# Patient Record
Sex: Female | Born: 1935 | Race: White | Hispanic: No | Smoking: Former smoker
Health system: Southern US, Community
[De-identification: ages and names within clinical notes are randomized; demographics above are authoritative.]

## PROBLEM LIST (undated history)

## (undated) DIAGNOSIS — E785 Hyperlipidemia, unspecified: Secondary | ICD-10-CM

## (undated) DIAGNOSIS — C801 Malignant (primary) neoplasm, unspecified: Secondary | ICD-10-CM

## (undated) DIAGNOSIS — E039 Hypothyroidism, unspecified: Secondary | ICD-10-CM

## (undated) DIAGNOSIS — M5481 Occipital neuralgia: Secondary | ICD-10-CM

## (undated) DIAGNOSIS — I10 Essential (primary) hypertension: Secondary | ICD-10-CM

## (undated) DIAGNOSIS — M1990 Unspecified osteoarthritis, unspecified site: Secondary | ICD-10-CM

## (undated) DIAGNOSIS — F0390 Unspecified dementia without behavioral disturbance: Secondary | ICD-10-CM

## (undated) DIAGNOSIS — M1711 Unilateral primary osteoarthritis, right knee: Secondary | ICD-10-CM

## (undated) DIAGNOSIS — M8580 Other specified disorders of bone density and structure, unspecified site: Secondary | ICD-10-CM

## (undated) HISTORY — PX: TUBAL LIGATION: SHX77

## (undated) HISTORY — PX: APPENDECTOMY: SHX54

## (undated) HISTORY — DX: Hyperlipidemia, unspecified: E78.5

## (undated) HISTORY — DX: Other specified disorders of bone density and structure, unspecified site: M85.80

## (undated) HISTORY — DX: Unilateral primary osteoarthritis, right knee: M17.11

## (undated) HISTORY — DX: Essential (primary) hypertension: I10

## (undated) HISTORY — DX: Malignant (primary) neoplasm, unspecified: C80.1

## (undated) HISTORY — DX: Hypothyroidism, unspecified: E03.9

## (undated) HISTORY — DX: Occipital neuralgia: M54.81

---

## 2009-05-30 ENCOUNTER — Ambulatory Visit: Payer: Self-pay | Admitting: Family Medicine

## 2009-05-30 DIAGNOSIS — R519 Headache, unspecified: Secondary | ICD-10-CM | POA: Insufficient documentation

## 2009-05-30 DIAGNOSIS — H60399 Other infective otitis externa, unspecified ear: Secondary | ICD-10-CM | POA: Insufficient documentation

## 2009-05-30 DIAGNOSIS — E785 Hyperlipidemia, unspecified: Secondary | ICD-10-CM

## 2009-05-30 DIAGNOSIS — R51 Headache: Secondary | ICD-10-CM

## 2009-05-30 DIAGNOSIS — I10 Essential (primary) hypertension: Secondary | ICD-10-CM | POA: Insufficient documentation

## 2009-05-30 DIAGNOSIS — E039 Hypothyroidism, unspecified: Secondary | ICD-10-CM | POA: Insufficient documentation

## 2009-07-13 ENCOUNTER — Ambulatory Visit: Payer: Self-pay | Admitting: Family Medicine

## 2009-07-13 DIAGNOSIS — M1710 Unilateral primary osteoarthritis, unspecified knee: Secondary | ICD-10-CM

## 2009-11-14 ENCOUNTER — Ambulatory Visit: Payer: Self-pay | Admitting: Family Medicine

## 2009-11-14 ENCOUNTER — Encounter: Admission: RE | Admit: 2009-11-14 | Discharge: 2009-11-14 | Payer: Self-pay | Admitting: Family Medicine

## 2009-11-15 ENCOUNTER — Encounter: Payer: Self-pay | Admitting: Family Medicine

## 2009-11-20 DIAGNOSIS — R928 Other abnormal and inconclusive findings on diagnostic imaging of breast: Secondary | ICD-10-CM | POA: Insufficient documentation

## 2009-12-04 ENCOUNTER — Ambulatory Visit: Payer: Self-pay | Admitting: Family Medicine

## 2009-12-20 ENCOUNTER — Telehealth: Payer: Self-pay | Admitting: Family Medicine

## 2010-01-02 DIAGNOSIS — C801 Malignant (primary) neoplasm, unspecified: Secondary | ICD-10-CM

## 2010-01-02 HISTORY — DX: Malignant (primary) neoplasm, unspecified: C80.1

## 2010-01-10 ENCOUNTER — Encounter: Admission: RE | Admit: 2010-01-10 | Discharge: 2010-01-10 | Payer: Self-pay | Admitting: Family Medicine

## 2010-01-10 ENCOUNTER — Encounter: Payer: Self-pay | Admitting: Family Medicine

## 2010-01-16 ENCOUNTER — Ambulatory Visit: Payer: Self-pay | Admitting: Family Medicine

## 2010-01-16 DIAGNOSIS — C50919 Malignant neoplasm of unspecified site of unspecified female breast: Secondary | ICD-10-CM | POA: Insufficient documentation

## 2010-01-19 ENCOUNTER — Encounter: Payer: Self-pay | Admitting: Family Medicine

## 2010-03-06 ENCOUNTER — Ambulatory Visit
Admission: RE | Admit: 2010-03-06 | Discharge: 2010-03-06 | Payer: Self-pay | Source: Home / Self Care | Attending: Family Medicine | Admitting: Family Medicine

## 2010-03-06 ENCOUNTER — Encounter: Payer: Self-pay | Admitting: Family Medicine

## 2010-03-06 DIAGNOSIS — Z780 Asymptomatic menopausal state: Secondary | ICD-10-CM | POA: Insufficient documentation

## 2010-03-07 ENCOUNTER — Encounter: Payer: Self-pay | Admitting: Family Medicine

## 2010-03-07 LAB — CONVERTED CEMR LAB
Alkaline Phosphatase: 79 units/L (ref 39–117)
BUN: 18 mg/dL (ref 6–23)
Creatinine, Ser: 1.16 mg/dL (ref 0.40–1.20)
Eosinophils Absolute: 0.1 10*3/uL (ref 0.0–0.7)
Eosinophils Relative: 1 % (ref 0–5)
Glucose, Bld: 111 mg/dL — ABNORMAL HIGH (ref 70–99)
HCT: 30.9 % — ABNORMAL LOW (ref 36.0–46.0)
HDL: 65 mg/dL (ref >39)
LDL Cholesterol: 81 mg/dL (ref 0–99)
Lymphs Abs: 2.5 10*3/uL (ref 0.7–4.0)
MCV: 94.5 fL (ref 78.0–100.0)
Monocytes Absolute: 0.9 10*3/uL (ref 0.1–1.0)
Monocytes Relative: 9 % (ref 3–12)
Platelets: 305 10*3/uL (ref 150–400)
Sodium: 135 meq/L (ref 135–145)
Total Bilirubin: 0.6 mg/dL (ref 0.3–1.2)
Triglycerides: 160 mg/dL — ABNORMAL HIGH (ref <150)
VLDL: 32 mg/dL (ref 0–40)
WBC: 10.3 10*3/uL (ref 4.0–10.5)

## 2010-03-14 ENCOUNTER — Encounter: Payer: Self-pay | Admitting: Family Medicine

## 2010-03-24 ENCOUNTER — Encounter: Payer: Self-pay | Admitting: Family Medicine

## 2010-03-25 ENCOUNTER — Encounter: Payer: Self-pay | Admitting: Family Medicine

## 2010-03-27 ENCOUNTER — Encounter: Payer: Self-pay | Admitting: Family Medicine

## 2010-04-03 NOTE — Consult Note (Signed)
Summary: [facility]   Imported By: Lanelle Bal 02/06/2010 13:43:34  _____________________________________________________________________  External Attachment:    Type:   Image     Comment:   External Document

## 2010-04-03 NOTE — Assessment & Plan Note (Signed)
Summary: NOV knee DJD/ med RFs   Vital Signs:  Patient profile:   77 year old female Height:      64 inches Weight:      137 pounds BMI:     23.60 O2 Sat:      98 % on Room air Pulse rate:   72 / minute BP sitting:   135 / 63  (left arm) Cuff size:   regular  Vitals Entered By: Payton Spark CMA (Jul 13, 2009 10:26 AM)  O2 Flow:  Room air CC: New to est.    Primary Care Provider:  Seymour Bars DO  CC:  New to est. .  History of Present Illness: 77 yo WF presents for NOV.  She has a hx of HTN, hypothyroidism, high cholesterol and osteopenia.  She is doing well on her meds and is due for RFs.  She recently had labs drawn in [place].  Her mammogram is due.  Last pap in 09 all normal and she is widowed w/o vaginal bleeding.  Was never on HRT.  Fells well other than DJD in her R hip s/p ORIF in 04 and severe knee DJD bilat.  She has seen Dr supple but refuses to do surgery.  she takes tylenol as needed pain and uses a cane when needed.  She lives alone and stays active around the house.    Current Medications (verified): 1)  Metoprolol Tartrate 50 Mg Tabs (Metoprolol Tartrate) .... Take 1 Tab By Mouth Once Daily 2)  Lisinopril-Hydrochlorothiazide 20-12.5 Mg Tabs (Lisinopril-Hydrochlorothiazide) .... Take 1 Tab By Mouth Once Daily 3)  Simvastatin 80 Mg Tabs (Simvastatin) .... Take 1 Tab By Mouth At Bedtime 4)  Levothroid 88 Mcg Tabs (Levothyroxine Sodium) .... Take 1 Tab By Mouth Once Daily 5)  Aspirin 81 Mg Tbec (Aspirin) .... Once Daily 6)  Vitamin D 2000 Unit Tabs (Cholecalciferol) .... Take 1 Tab By Mouth Once Daily  Allergies (verified): 1)  ! Codeine Sulfate (Codeine Sulfate) 2)  ! Penicillin G Potassium (Penicillin G Potassium)  Past History:  Past Medical History: Hyperlipidemia Hypertension Hypothyroidism Occipital neuralgia 30 years ago osteopenia  ortho Dr Rennis Chris  Past Surgical History: tubal ligation Appendectomy Hip surgery- R, ORIF in 04.  Social  History: quit smoking 12 years ago after smoking for several years Alcohol use-yes- glass of win with dinner Drug use-no Regular exercise-no Lives alone. Widowed since 2003.  2 sons - in [place] and [place].  Review of Systems       no fevers/sweats/weakness, unexplained wt loss/gain, no change in vision, no difficulty hearing, ringing in ears, no hay fever/allergies, no CP/discomfort, no palpitations, no breast lump/nipple discharge, no cough/wheeze, no blood in stool,no  N/V/D, no nocturia, + leaking urine, no unusual vag bleeding, no vaginal/penile discharge, no muscle/+joint pain, no rash, no new/changing mole, no HA, no memory loss, no anxiety, no sleep problem, no depression, no unexplained lumps, no easy bruising/bleeding, no concern with sexual function   Physical Exam  General:  alert, well-developed, well-nourished, and well-hydrated.   Head:  normocephalic and atraumatic.   Eyes:  clouding of the anterior chambers bilat Ears:  scab, healing over the L pinna Nose:  no nasal discharge.   Mouth:  pharynx pink and moist.   Neck:  no masses.  no audible carotid bruits Lungs:  normal respiratory effort, no intercostal retractions, no accessory muscle use, normal breath sounds, no crackles, and no wheezes.   Heart:  normal rate, regular rhythm, and no murmur.  Abdomen:  soft, non-tender, normal bowel sounds, no distention, and no guarding.   Msk:  no joint swelling, no joint warmth, and no redness over joints.   Pulses:  2+ radial pulses Extremities:  no LE edema Neurologic:  antalgic gait Skin:  color normal.   Psych:  memory intact for recent and remote, good eye contact, not anxious appearing, and not depressed appearing.     Impression & Recommendations:  Problem # 1:  GENERALIZED OSTEOARTHROSIS UNSPECIFIED SITE (ICD-715.00) DJD in the knees and R hip.  We discussed options like Celebrex, synvisc for her knees.  She does not want joint replacement.  For now, she agrees to use  Tylenol arthritis as needed pain.  Will consider NSAIDs after I get her BUN/Cr from [place]. Her updated medication list for this problem includes:    Aspirin 81 Mg Tbec (Aspirin) ..... Once daily  Problem # 2:  HYPERTENSION (ICD-401.9) At goal on current meds.  RFd.  Get old records. Her updated medication list for this problem includes:    Metoprolol Succinate 50 Mg Xr24h-tab (Metoprolol succinate) .Marland Kitchen... 1 tab by mouth once daily    Lisinopril-hydrochlorothiazide 20-12.5 Mg Tabs (Lisinopril-hydrochlorothiazide) .Marland Kitchen... Take 1 tab by mouth once daily  BP today: 135/63 Prior BP: 167/81 (05/30/2009)  Problem # 3:  HYPOTHYROIDISM (ICD-244.9)  Her updated medication list for this problem includes:    Levothroid 88 Mcg Tabs (Levothyroxine sodium) .Marland Kitchen... Take 1 tab by mouth once daily  Problem # 4:  OTHER SCREENING MAMMOGRAM (ICD-V76.12)  Orders: T-Mammography Bilateral Screening (09811)  Complete Medication List: 1)  Metoprolol Succinate 50 Mg Xr24h-tab (Metoprolol succinate) .Marland Kitchen.. 1 tab by mouth once daily 2)  Lisinopril-hydrochlorothiazide 20-12.5 Mg Tabs (Lisinopril-hydrochlorothiazide) .... Take 1 tab by mouth once daily 3)  Simvastatin 80 Mg Tabs (Simvastatin) .... Take 1 tab by mouth at bedtime 4)  Levothroid 88 Mcg Tabs (Levothyroxine sodium) .... Take 1 tab by mouth once daily 5)  Aspirin 81 Mg Tbec (Aspirin) .... Once daily 6)  Vitamin D 2000 Unit Tabs (Cholecalciferol) .... Take 1 tab by mouth once daily  Patient Instructions: 1)  Meds RFd -- to [employer] [facility]. 2)  Update mammogram downstairs. 3)  Return for follow up arthritis/ BP in 4 mos. 4)  Try Tyelnol arthritis as needed for pain. 5)  Call if you want referral to ortho downstairs  re: synvisc injections for severe knee arthritis Prescriptions: LEVOTHROID 88 MCG TABS (LEVOTHYROXINE SODIUM) Take 1 tab by mouth once daily  #90 x 1   Entered and Authorized by:   Seymour Bars DO   Signed by:   Seymour Bars DO on  07/13/2009   Method used:   Print then Give to Patient   RxID:   [id number] SIMVASTATIN 80 MG TABS (SIMVASTATIN) Take 1 tab by mouth at bedtime  #90 x 1   Entered and Authorized by:   Seymour Bars DO   Signed by:   Seymour Bars DO on 07/13/2009   Method used:   Print then Give to Patient   RxID:   [id number] LISINOPRIL-HYDROCHLOROTHIAZIDE 20-12.5 MG TABS (LISINOPRIL-HYDROCHLOROTHIAZIDE) Take 1 tab by mouth once daily  #90 x 1   Entered and Authorized by:   Seymour Bars DO   Signed by:   Seymour Bars DO on 07/13/2009   Method used:   Print then Give to Patient   RxID:   [id number] METOPROLOL SUCCINATE 50 MG XR24H-TAB (METOPROLOL SUCCINATE) 1 tab by mouth once daily  #90 x 1  Entered and Authorized by:   Seymour Bars DO   Signed by:   Seymour Bars DO on 07/13/2009   Method used:   Print then Give to Patient   RxID:   [id number]
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
## 2010-04-03 NOTE — Assessment & Plan Note (Signed)
Summary: DJD knee   Vital Signs:  Patient profile:   77 year old female Height:      64 inches Weight:      136 pounds BMI:     23.43 O2 Sat:      99 % on Room air Pulse rate:   80 / minute BP sitting:   140 / 82  (left arm) Cuff size:   regular  Vitals Entered By: Payton Spark CMA (November 14, 2009 1:36 PM)  O2 Flow:  Room air CC: F/U HTN and arthritis   Primary Care Safira Proffit:  Seymour Bars DO  CC:  F/U HTN and arthritis.  History of Present Illness: 77 yo WF presents for f/u R DJD that began about 2 yrs ago.  She had an XRAY done out of town originally.  She wanted to hold off on joint replacment.  She is not taking anything OTC.  She is interested in Synvisc injections.  She feels like her knee pain limits her functional ability.  She has never had a steroid injection in her knee.  She has never had any swelling.  Denies any problems with the L knee or hx of injuries to the R knee or previous surgeries.    She just had her mammogram.  Reports no problems on her BP meds.  She had labs checked in Jan 2011.   Current Medications (verified): 1)  Metoprolol Succinate 50 Mg Xr24h-Tab (Metoprolol Succinate) .Marland Kitchen.. 1 Tab By Mouth Once Daily 2)  Lisinopril-Hydrochlorothiazide 20-12.5 Mg Tabs (Lisinopril-Hydrochlorothiazide) .... Take 1 Tab By Mouth Once Daily 3)  Simvastatin 80 Mg Tabs (Simvastatin) .... Take 1 Tab By Mouth At Bedtime 4)  Levothroid 88 Mcg Tabs (Levothyroxine Sodium) .... Take 1 Tab By Mouth Once Daily 5)  Aspirin 81 Mg Tbec (Aspirin) .... Once Daily 6)  Vitamin D 2000 Unit Tabs (Cholecalciferol) .... Take 1 Tab By Mouth Once Daily  Allergies (verified): 1)  ! Codeine Sulfate (Codeine Sulfate) 2)  ! Penicillin G Potassium (Penicillin G Potassium)  Past History:  Past Medical History: Hyperlipidemia Hypertension Hypothyroidism Occipital neuralgia 30 years ago osteopenia R knee DJD  Past Surgical History: Reviewed history from 07/13/2009 and no changes  required. tubal ligation Appendectomy Hip surgery- R, ORIF in 04.  Social History: Reviewed history from 07/13/2009 and no changes required. quit smoking 12 years ago after smoking for several years Alcohol use-yes- glass of win with dinner Drug use-no Regular exercise-no Lives alone. Widowed since 2003.  2 sons - in [place] and [place].  Review of Systems      See HPI  Physical Exam  General:  alert, well-developed, well-nourished, and well-hydrated.   Head:  normocephalic and atraumatic.   Mouth:  pharynx pink and moist.   Neck:  no masses.   Lungs:  normal respiratory effort, no intercostal retractions, no accessory muscle use, normal breath sounds, no crackles, and no wheezes.   Heart:  normal rate, regular rhythm, and no murmur.   Msk:  no joint effusions. antalgic gait toward the L.  ambulating w/o assistance or device tender over later joint line with a neg patellar apprehension test and a neg mcmurray test Extremities:  no LE edema Skin:  color normal.   Psych:  good eye contact, not anxious appearing, and not depressed appearing.     Impression & Recommendations:  Problem # 1:  OSTEOARTHRITIS, KNEE, RIGHT (ICD-715.96) R knee DJD with xray  ~2 yrs ago (not here).  She c/o pain and some limitations  in function but so far, has not taken anything or tried any kind of intervention other than sporadic use of a cane. Will start her on celebrex once daily + glucosamine daily. REcheck in 6 wks.  Call if any adverse SEs occur.  If not improving, will XRAY her knee and get her in with ortho.  May benefit from steroid injection in the knee or Synvisc series. Her updated medication list for this problem includes:    Aspirin 81 Mg Tbec (Aspirin) ..... Once daily    Celebrex 200 Mg Caps (Celecoxib) .Marland Kitchen... 1 capsule by mouth daily with food  Problem # 2:  HYPERTENSION (ICD-401.9) BP at goal.  Continue both meds.  REcheck labs in Jan. Her updated medication list for this problem  includes:    Metoprolol Succinate 50 Mg Xr24h-tab (Metoprolol succinate) .Marland Kitchen... 1 tab by mouth once daily    Lisinopril-hydrochlorothiazide 20-12.5 Mg Tabs (Lisinopril-hydrochlorothiazide) .Marland Kitchen... Take 1 tab by mouth once daily  BP today: 140/82 Prior BP: 135/63 (07/13/2009)  Problem # 3:  HYPOTHYROIDISM (ICD-244.9) Recheck TSH at 6 wk f/u visit. Her updated medication list for this problem includes:    Levothroid 88 Mcg Tabs (Levothyroxine sodium) .Marland Kitchen... Take 1 tab by mouth once daily  Complete Medication List: 1)  Metoprolol Succinate 50 Mg Xr24h-tab (Metoprolol succinate) .Marland Kitchen.. 1 tab by mouth once daily 2)  Lisinopril-hydrochlorothiazide 20-12.5 Mg Tabs (Lisinopril-hydrochlorothiazide) .... Take 1 tab by mouth once daily 3)  Simvastatin 80 Mg Tabs (Simvastatin) .... 1/2 tab by mouth at bedtime 4)  Levothroid 88 Mcg Tabs (Levothyroxine sodium) .... Take 1 tab by mouth once daily 5)  Aspirin 81 Mg Tbec (Aspirin) .... Once daily 6)  Vitamin D 2000 Unit Tabs (Cholecalciferol) .... Take 1 tab by mouth once daily 7)  Celebrex 200 Mg Caps (Celecoxib) .Marland Kitchen.. 1 capsule by mouth daily with food  Patient Instructions: 1)  Start Celebrex,  1 capsule with breakfast daily. 2)  Add Osteo- Biflex daily (glucosamine) for arthritis. 3)  Return in 6 wks.  4)  If DJD pain has not improved, will XRAY your knee and get you in with ortho. 5)  Call if any problems. Prescriptions: CELEBREX 200 MG CAPS (CELECOXIB) 1 capsule by mouth daily with food  #30 x 1   Entered and Authorized by:   Seymour Bars DO   Signed by:   Seymour Bars DO on 11/14/2009   Method used:   Printed then faxed to ...       [facility]  [address]  [id number] (retail)       [address]       [place], [place]  [address]       Ph: [phone] or [phone]       Fax: [phone]   RxID:   [id number]

## 2010-04-03 NOTE — Medication Information (Signed)
Summary: Approval for Celebrex/[employer]  Approval for Celebrex/[employer]   Imported By: Lanelle Bal 12/01/2009 11:27:25  _____________________________________________________________________  External Attachment:    Type:   Image     Comment:   External Document

## 2010-04-03 NOTE — Assessment & Plan Note (Signed)
Summary: HEAD PAIN   Vital Signs:  Patient Profile:   77 Years Old Female CC:      Occipital head pain intermittent- dull X 4 days Height:     64 inches Weight:      137 pounds O2 Sat:      100 % O2 treatment:    Room Air Temp:     97.0 degrees F oral Pulse rate:   67 / minute Pulse rhythm:   regular Resp:     14 per minute BP sitting:   167 / 81  (left arm) Cuff size:   regular  Pt. in pain?   no    Location:   occipital  Vitals Entered By: Lajean Saver RN (May 30, 2009 2:37 PM)                   Updated Prior Medication List: LOPRESSOR 50 MG TABS (METOPROLOL TARTRATE) once daily LISINOPRIL 20 MG TABS (LISINOPRIL) once daily SIMVASTATIN 80 MG TABS (SIMVASTATIN) qhs LEVOTHROID 88 MCG TABS (LEVOTHYROXINE SODIUM) once daily ASPIRIN 81 MG TBEC (ASPIRIN) once daily  Current Allergies: ! CODEINE SULFATE (CODEINE SULFATE) ! PENICILLIN G POTASSIUM (PENICILLIN G POTASSIUM)History of Present Illness Chief Complaint: Occipital head pain intermittent- dull X 4 days History of Present Illness: Subjective:  Patient complains of onset of occipital headache 3 days ago.  The headache consists of intermittent brief flashes of dull ache, which does not awaken her.  At the same time she developed a sensitivity of her left ear.  She denies rash.  No neuro symptoms.  No fevers, chills, and sweats.  She feels well.  No recent injury or change in physical activities.  She states that about 35 years ago she had occipital neuralgia that was effectively treated with Corgard.  She states that she had a Zostavax injection one year ago.  REVIEW OF SYSTEMS Constitutional Symptoms      Denies fever, chills, night sweats, weight loss, weight gain, and fatigue.  Eyes       Denies change in vision, eye pain, eye discharge, glasses, contact lenses, and eye surgery. Ear/Nose/Throat/Mouth       Denies hearing loss/aids, change in hearing, ear pain, ear discharge, dizziness, frequent runny nose,  frequent nose bleeds, sinus problems, sore throat, hoarseness, and tooth pain or bleeding.  Respiratory       Denies dry cough, productive cough, wheezing, shortness of breath, asthma, bronchitis, and emphysema/COPD.  Cardiovascular       Denies murmurs, chest pain, and tires easily with exhertion.    Gastrointestinal       Denies stomach pain, nausea/vomiting, diarrhea, constipation, blood in bowel movements, and indigestion. Genitourniary       Denies painful urination, kidney stones, and loss of urinary control. Neurological       Complains of headaches.      Denies paralysis, seizures, and fainting/blackouts. Musculoskeletal       Denies muscle pain, joint pain, joint stiffness, decreased range of motion, redness, swelling, muscle weakness, and gout.  Skin       Denies bruising, unusual mles/lumps or sores, and hair/skin or nail changes.  Psych       Denies mood changes, temper/anger issues, anxiety/stress, speech problems, depression, and sleep problems. Other Comments: Patient was diagnosed with ocipital neuralgia 30 years ago, HAs resumed 4 days ago in her occipital region, the pain is described as an ache and intermittent. Denies nausea or vison changes. She does not currently c/o HA  Past History:  Past Medical History: Hyperlipidemia Hypertension Hypothyroidism Occipital neuralgia 30 years ago  Past Surgical History: tubal ligation Appendectomy  Family History: unsure  Social History: quit smoking 12 years ago after smoking for several years Alcohol use-yes- glass of win with dinner Drug use-no Regular exercise-no Smoking Status:  current Drug Use:  no Does Patient Exercise:  no   Objective:  Appearance:  Patient appears healthy, stated age, and in no acute distress  Head:  no tenderness to palpation occipital area.  No rash.  No swelling Eyes:  Pupils are equal, round, and reactive to light and accomdation.  Extraocular movement is intact.  Conjunctivae  are not inflamed.  Fundi benign Ears:  Canals normal.  Tympanic membranes normal.  Left external ear has 2cm area of erythema and tenderness over the anterior portion (crus of helix) Nose:  No congestion.  No sinus tenderness Pharynx:  Normal  Neck:  Supple.  No adenopathy is present; full range of motion  Lungs:  Clear to auscultation.  Breath sounds are equal.  Heart:  Regular rate and rhythm without murmurs, rubs, or gallops.  Abdomen:  Nontender without masses or hepatosplenomegaly.  Bowel sounds are present.  No CVA or flank tenderness.  Neurologic:  Cranial nerves 2 through 12 are normal.  Patellar, achilles, and elbow reflexes are normal.  Cerebellar function is intact.  Gait and station are normal.  Grip strength symmetric bilaterally.   Assessment New Problems: UNSPECIFIED INFECTIVE OTITIS EXTERNA (ICD-380.10) HEADACHE (ICD-784.0) HYPOTHYROIDISM (ICD-244.9) HYPERTENSION (ICD-401.9) HYPERLIPIDEMIA (ICD-272.4)  SYMPTOMS ARE SUGGESTIVE OF HERPES ZOSTER BUT SHE HAS BEEN IMMUNIZED.  WILL TREAT FOR CELLULITIS OF EXTERNAL EAR. HAS NORMAL NEUROLOGIC EXAM  Plan New Medications/Changes: MINOCYCLINE HCL 100 MG CAPS (MINOCYCLINE HCL) 1 by mouth two times a day  #14 x 0, 05/30/2009, Donna Christen MD  New Orders: New Patient Level III [phone] Planning Comments:   Begin minocycline.  Apply warm compresses.  May take Tylenol If headache worsens will need MRI (Follow-up with PCP if not improving or develops worsening symptoms)   The patient and/or caregiver has been counseled thoroughly with regard to medications prescribed including dosage, schedule, interactions, rationale for use, and possible side effects and they verbalize understanding.  Diagnoses and expected course of recovery discussed and will return if not improved as expected or if the condition worsens. Patient and/or caregiver verbalized understanding.  Prescriptions: MINOCYCLINE HCL 100 MG CAPS (MINOCYCLINE HCL) 1 by mouth two  times a day  #14 x 0   Entered and Authorized by:   Donna Christen MD   Signed by:   Donna Christen MD on 05/30/2009   Method used:   Print then Give to Patient   RxID:   [id number]

## 2010-04-03 NOTE — Progress Notes (Signed)
Summary: Pt question about 01/02/10 appt  Phone Note Call from Patient Call back at Home Phone [phone]   Caller: Patient Reason for Call: Talk to Nurse Summary of Call: Pt states celebrex is not helping knee pain, she is not sure if she needs to keep 01/02/10 appt since she doesn't feel like there is anything that can be done for her arthritis in her knee, pls call pt back to advise Initial call taken by: Lannette Donath,  December 20, 2009 11:08 AM  Follow-up for Phone Call        I reviewed her notes.  She does not need to keep this appt with me but I do want her to see ortho for her knee pain.  It looks like we discussed ortho doing synvisc shots as the next step.  Let me know if she needs [place] to set this up for her. Follow-up by: Seymour Bars DO,  December 20, 2009 12:04 PM     Appended Document: Pt question about 01/02/10 appt Pt states she will wait to see how she feels and call us

## 2010-04-03 NOTE — Assessment & Plan Note (Signed)
Summary: newly diagnosed breast cancer   Vital Signs:  Patient profile:   77 year old female Height:      64 inches Weight:      135 pounds BMI:     23.26 O2 Sat:      99 % on Room air Pulse rate:   79 / minute BP sitting:   128 / 72  (left arm) Cuff size:   regular  Vitals Entered By: Payton Spark CMA (January 16, 2010 12:49 PM)  O2 Flow:  Room air CC: Discuss biopsy report   Primary Care Provider:  Seymour Bars DO  CC:  Discuss biopsy report.  History of Present Illness: 77 yo WF presents for f/u after having a breast biopsy done on 11-9/11 after having an abnormal mammogram done.  Her path came back + for invasive ductal carcinoma that appears to be low grade with no evidence of angiolymphatic invasion.  She was set up to see a surgeone but they were not covered under her insurance.  She would like to proceed with seeing a gen surgeron at [facility].    She has not yet told her family.  She is tearful.  She is bruised from the biopsy but she is not having much pain.    Allergies: 1)  ! Codeine Sulfate (Codeine Sulfate) 2)  ! Penicillin G Potassium (Penicillin G Potassium)  Past History:  Past Medical History: Reviewed history from 11/14/2009 and no changes required. Hyperlipidemia Hypertension Hypothyroidism Occipital neuralgia 30 years ago osteopenia R knee DJD  Past Surgical History: Reviewed history from 07/13/2009 and no changes required. tubal ligation Appendectomy Hip surgery- R, ORIF in 04.  Family History: Reviewed history from 05/30/2009 and no changes required. unsure  Social History: Reviewed history from 07/13/2009 and no changes required. quit smoking 12 years ago after smoking for several years Alcohol use-yes- glass of win 5with dinner Drug use-no Regular exercise-no Lives alone. Widowed since 2003.  2 sons - in [place] and [place].  Review of Systems      See HPI  Physical Exam  General:  alert, well-developed, well-nourished, and  well-hydrated.   Breasts:  brusing over the entire R breast.  steri - strips in place.  biopsy site appears clean. Psych:  good eye contact and tearful.     Impression & Recommendations:  Problem # 1:  NEOPLASM, MALIGNANT, RIGHT BREAST (ICD-174.9)  Reviewed R breast biopsy path results + for intraductal carcinoma.  She is handling the news fairly well but we need to find a general surgeon that she can see with her insurance.  Biopsy site bruised but is healing well with minimal pain.    Orders: Surgical Referral (Surgery)  Complete Medication List: 1)  Metoprolol Succinate 50 Mg Xr24h-tab (Metoprolol succinate) .Marland Kitchen.. 1 tab by mouth once daily 2)  Lisinopril-hydrochlorothiazide 20-12.5 Mg Tabs (Lisinopril-hydrochlorothiazide) .... Take 1 tab by mouth once daily 3)  Simvastatin 80 Mg Tabs (Simvastatin) .... 1/2 tab by mouth at bedtime 4)  Levothroid 88 Mcg Tabs (Levothyroxine sodium) .... Take 1 tab by mouth once daily 5)  Aspirin 81 Mg Tbec (Aspirin) .... Once daily 6)  Vitamin D 2000 Unit Tabs (Cholecalciferol) .... Take 1 tab by mouth once daily 7)  Celebrex 200 Mg Caps (Celecoxib) .Marland Kitchen.. 1 capsule by mouth daily with food  Patient Instructions: 1)  Will work on getting you in with a general surgeon in [place]. 2)  Selena Batten will call you w/ the information. 3)  Call me if you need  anything!!!   Orders Added: 1)  Est. Patient Level III [45409] 2)  Surgical Referral [Surgery]

## 2010-04-04 HISTORY — PX: BREAST SURGERY: SHX581

## 2010-04-05 NOTE — Assessment & Plan Note (Signed)
Summary: CPE   Vital Signs:  Patient profile:   77 year old female Height:      64 inches Weight:      134 pounds BMI:     23.08 O2 Sat:      99 % on Room air Pulse rate:   73 / minute BP sitting:   157 / 77  (left arm) Cuff size:   regular  Vitals Entered By: Payton Spark CMA (March 06, 2010 10:12 AM)  O2 Flow:  Room air CC: CPE. Fasting labs.    Primary Care Provider:  Seymour Bars DO  CC:  CPE. Fasting labs. .  History of Present Illness: 77 yo WF presents for CPE today.  She was recently diagnosed with R breast cancer but has put off her lumpectomy and radiation treatment after seeing Dr Rayburn Ma for issues with transportatin, etc.  She plans to proceed with seeing Dr Tennis Ship in [facility] and doing her surgery with Dr Lafayette Dragon.  She would be OK with doing radiation.  She is due for an EKG, fasting labs, DEXA and TD vaccine.  Had her PNX at age 88.  Denies CP or DOE and has no cardiac hx.  She sees optho annually.  Does not exercise due to knee DJD.    Historically, she had a 'normal' colonoscopy 3 yrs ago and a 'normal' pap smear 2 yrs ago.  She is widowed with no previous cervical dysplasia.  She is not sexually active and denies any vag bleeding or discharge.     Current Medications (verified): 1)  Metoprolol Succinate 50 Mg Xr24h-Tab (Metoprolol Succinate) .Marland Kitchen.. 1 Tab By Mouth Once Daily 2)  Lisinopril-Hydrochlorothiazide 20-12.5 Mg Tabs (Lisinopril-Hydrochlorothiazide) .... Take 1 Tab By Mouth Once Daily 3)  Simvastatin 80 Mg Tabs (Simvastatin) .... 1/2 Tab By Mouth At Bedtime 4)  Levothroid 88 Mcg Tabs (Levothyroxine Sodium) .... Take 1 Tab By Mouth Once Daily 5)  Aspirin 81 Mg Tbec (Aspirin) .... Once Daily 6)  Vitamin D 2000 Unit Tabs (Cholecalciferol) .... Take 1 Tab By Mouth Once Daily 7)  Celebrex 200 Mg Caps (Celecoxib) .Marland Kitchen.. 1 Capsule By Mouth Daily With Food  Allergies (verified): 1)  ! Codeine Sulfate (Codeine Sulfate) 2)  ! Penicillin G Potassium (Penicillin  G Potassium)  Past History:  Past Medical History: Hyperlipidemia Hypertension Hypothyroidism Occipital neuralgia 30 years ago osteopenia R knee DJD R breast Cancer 01-2010 (Dr Kathaleen Grinder)  Past Surgical History: Reviewed history from 07/13/2009 and no changes required. tubal ligation Appendectomy Hip surgery- R, ORIF in 04.  Social History: Reviewed history from 01/16/2010 and no changes required. quit smoking 12 years ago after smoking for several years Alcohol use-yes- glass of win 5with dinner Drug use-no Regular exercise-no Lives alone. Widowed since 2003.  2 sons - in [place] and [place].  Review of Systems  The patient denies anorexia, fever, weight loss, weight gain, vision loss, decreased hearing, hoarseness, chest pain, syncope, dyspnea on exertion, peripheral edema, prolonged cough, headaches, hemoptysis, abdominal pain, melena, hematochezia, severe indigestion/heartburn, hematuria, incontinence, genital sores, muscle weakness, suspicious skin lesions, transient blindness, difficulty walking, depression, unusual weight change, abnormal bleeding, enlarged lymph nodes, angioedema, breast masses, and testicular masses.    Physical Exam  General:  alert, well-developed, well-nourished, and well-hydrated.   Head:  normocephalic and atraumatic.   Eyes:  pupils equal, pupils round, and pupils reactive to light.   Ears:  EACs patent; TMs translucent and gray with good cone of light and bony landmarks.  Nose:  no nasal discharge.   Mouth:  pharynx pink and moist and fair dentition.   Neck:  supple and no masses.  no audible carotid bruits Breasts:  (seeing gen surgery and oncology for new dx of R breast cancer) Lungs:  Normal respiratory effort, chest expands symmetrically. Lungs are clear to auscultation, no crackles or wheezes. Heart:  Normal rate and regular rhythm. S1 and S2 normal without gallop, murmur, click, rub or other extra sounds. Abdomen:  soft, non-tender,  normal bowel sounds, no distention, no masses, no guarding, no rigidity, no hepatomegaly, and no splenomegaly.  no AA bruits Msk:  no joint tenderness, no joint swelling, and no joint warmth.    heberdens/ bouchards nodes Pulses:  2+ radial and pedal pulses Extremities:  no UE or LE edema Skin:  color normal.  fair skinned. Cervical Nodes:  No lymphadenopathy noted Psych:  good eye contact, not anxious appearing, and not depressed appearing.     Impression & Recommendations:  Problem # 1:  HEALTH MAINTENANCE EXAM (ICD-V70.0) Keeping healthy checklist for women reviewed. BP high today.  Did not change her meds. Seeing Dr Tennis Ship Dr Lafayette Dragon for R breast cancer -- NEEDS TO SCHEDULE HER LUMPECTOMY AND RADIATION. Update DEXA. Update TD vaccine. Colonoscopy due in 2 yrs. Fasting labs today. EKG today.  NSR at 60 bpm with right atrial enlargement, QTC of 436 ms, o/w no ischemia or arrhythmia. MVI/ Calcium with D daily, healthy diet and regular exercise encouraged. RTC 4 mos for BP f/u.  Complete Medication List: 1)  Metoprolol Succinate 50 Mg Xr24h-tab (Metoprolol succinate) .Marland Kitchen.. 1 tab by mouth once daily 2)  Lisinopril-hydrochlorothiazide 20-12.5 Mg Tabs (Lisinopril-hydrochlorothiazide) .... Take 1 tab by mouth once daily 3)  Simvastatin 80 Mg Tabs (Simvastatin) .... 1/2 tab by mouth at bedtime 4)  Levothroid 88 Mcg Tabs (Levothyroxine sodium) .... Take 1 tab by mouth once daily 5)  Aspirin 81 Mg Tbec (Aspirin) .... Once daily 6)  Vitamin D 2000 Unit Tabs (Cholecalciferol) .... Take 1 tab by mouth once daily 7)  Celebrex 200 Mg Caps (Celecoxib) .Marland Kitchen.. 1 capsule by mouth daily with food  Other Orders: EKG w/ Interpretation (93000) T-CBC w/Diff ([id number]) T-Comprehensive Metabolic Panel [phone]) T-Lipid Profile [phone]) T-TSH [phone]) T-Dual DXA Bone Density/ Axial (38756) T-DXA Bone Density/ Appendicular (43329) TD Toxoids IM 7 YR + (51884) Admin 1st Vaccine  (16606)  Patient Instructions: 1)  EKG normal. 2)  Labs today. 3)  Will call you w/ results tomorrow. 4)  Will make sure you have everything you need for f/u with Dr Sophronia Simas. 5)  Return for f/u in 4 mos.   Orders Added: 1)  Est. Patient age 56&> [30160] 2)  EKG w/ Interpretation [93000] 3)  T-CBC w/Diff [[id number]] 4)  T-Comprehensive Metabolic Panel [[id number]] 5)  T-Lipid Profile [[id number]] 6)  T-TSH [[id number]] 7)  T-Dual DXA Bone Density/ Axial [77080] 8)  T-DXA Bone Density/ Appendicular [77081] 9)  TD Toxoids IM 7 YR + [90714] 10)  Admin 1st Vaccine [23762]   Immunizations Administered:  Tetanus Vaccine:    Vaccine Type: Td    Site: left deltoid    Dose: 0.5 ml    Route: IM    Given by: Payton Spark CMA    Exp. Date: 05/28/2011    Lot #: [id number]    VIS given: 01/20/08 version given March 06, 2010.   Immunizations Administered:  Tetanus Vaccine:    Vaccine Type: Td    Site: left deltoid    Dose:  0.5 ml    Route: IM    Given by: Payton Spark CMA    Exp. Date: 05/28/2011    Lot #: [id number]    VIS given: 01/20/08 version given March 06, 2010.  Appended Document: CPE

## 2010-04-25 NOTE — Letter (Signed)
Summary: [facility]iation [facility]  [facility]   Imported By: Lanelle Bal 04/17/2010 13:14:19  _____________________________________________________________________  External Attachment:    Type:   Image     Comment:   External Document

## 2010-04-25 NOTE — Letter (Signed)
Summary: Radiation Oncology Department [facility]   Radiation Oncology Department [facility]   Imported By: Kassie Mends 04/19/2010 09:05:44  _____________________________________________________________________  External Attachment:    Type:   Image     Comment:   External Document

## 2010-04-25 NOTE — Letter (Signed)
Summary: [facility]ology [facility]   Imported By: Lanelle Bal 04/17/2010 12:27:37  _____________________________________________________________________  External Attachment:    Type:   Image     Comment:   External Document

## 2010-05-18 ENCOUNTER — Encounter: Payer: Self-pay | Admitting: Family Medicine

## 2010-05-22 NOTE — Miscellaneous (Signed)
Summary: R mastectomy  Clinical Lists Changes  Observations: Added new observation of PAST SURG HX: tubal ligation Appendectomy Hip surgery- R, ORIF in 04. R breast segmental mastectomy-- Dr Lafayette Dragon [phone] (05/18/2010 8:46) Added new observation of PRIMARY MD: Seymour Bars DO (05/18/2010 8:46)       Past History:  Past Surgical History: tubal ligation Appendectomy Hip surgery- R, ORIF in 04. R breast segmental mastectomy-- Dr Lafayette Dragon [phone]

## 2010-07-03 ENCOUNTER — Encounter: Payer: Self-pay | Admitting: Family Medicine

## 2010-07-04 ENCOUNTER — Ambulatory Visit (INDEPENDENT_AMBULATORY_CARE_PROVIDER_SITE_OTHER): Payer: 59 | Admitting: Family Medicine

## 2010-07-04 ENCOUNTER — Telehealth: Payer: Self-pay | Admitting: Family Medicine

## 2010-07-04 ENCOUNTER — Encounter: Payer: Self-pay | Admitting: Family Medicine

## 2010-07-04 VITALS — BP 143/79 | HR 56 | Temp 97.7°F | Ht 64.0 in | Wt 132.0 lb

## 2010-07-04 DIAGNOSIS — E039 Hypothyroidism, unspecified: Secondary | ICD-10-CM

## 2010-07-04 DIAGNOSIS — N390 Urinary tract infection, site not specified: Secondary | ICD-10-CM | POA: Insufficient documentation

## 2010-07-04 LAB — POCT URINALYSIS DIPSTICK
Bilirubin, UA: NEGATIVE
Ketones, UA: NEGATIVE
Protein, UA: NEGATIVE
Spec Grav, UA: 1.01
pH, UA: 5.5

## 2010-07-04 MED ORDER — CIPROFLOXACIN HCL 250 MG PO TABS: 250.0000 mg | ORAL_TABLET | Freq: Two times a day (BID) | ORAL | Status: AC

## 2010-07-04 MED ORDER — NITROFURANTOIN MONOHYD MACRO 100 MG PO CAPS: 100.0000 mg | ORAL_CAPSULE | Freq: Two times a day (BID) | ORAL | Status: DC

## 2010-07-04 NOTE — Assessment & Plan Note (Signed)
UA +.  Will treat with macrobid bid x 7 days.  Call if symptoms persist.

## 2010-07-04 NOTE — Assessment & Plan Note (Signed)
Due to recheck TSH today and adjust thyroid meds.

## 2010-07-04 NOTE — Patient Instructions (Signed)
Check TSH today. Will adjust your thyroid medicine over the phone tomorrow and will send new RX to [facility].  Start Macrobid today for UTI.  Take for full 7 day. OK to add Cystex pure cranberry. Call if urinary symptoms have not resolved in the next wk.

## 2010-07-04 NOTE — Telephone Encounter (Signed)
pls let pt know that i changed her antibiotic from macrobid to cipro due to insurance coverage.  rx sent to pharmacy.

## 2010-07-04 NOTE — Progress Notes (Signed)
Subjective:    Patient ID: Arian Yunk, female    DOB: Jul 31, 1930, 77 y.o.   MRN: [id number]  HPI  77 yo WF presents for urinary symptoms that started 6 days ago.  She started taking AZO which helped a little bit.  She was having urgency and frequnecy but no dysuria, abd pain, flank.  No fevers or chills.  No nausea.  Has never had a UTI.  No vag discharge.  She is due to have her TSH checked.  She is doing great after having R lumpectomy and radiation.   BP 143/79  Pulse 56  Temp(Src) 97.7 F (36.5 C) (Oral)  Ht 5\' 4"  (1.626 m)  Wt 132 lb (59.875 kg)  BMI 22.66 kg/m2  SpO2 96%     Review of Systems  Constitutional: Negative for fever and fatigue.  Respiratory: Negative for shortness of breath.   Cardiovascular: Negative for chest pain, palpitations and leg swelling.  Gastrointestinal: Negative for nausea and abdominal pain.       Objective:   Physical Exam  Constitutional: She appears well-developed and well-nourished.  Cardiovascular: Normal rate, regular rhythm and normal heart sounds.   No murmur heard. Pulmonary/Chest: Effort normal and breath sounds normal.  Abdominal: Soft. She exhibits no distension. There is no tenderness. There is no guarding.       No CVAT  Musculoskeletal: She exhibits no edema.  Lymphadenopathy:    She has no cervical adenopathy.  Skin: Skin is warm and dry.  Psychiatric: She has a normal mood and affect.          Assessment & Plan:

## 2010-07-05 ENCOUNTER — Telehealth: Payer: Self-pay | Admitting: Family Medicine

## 2010-07-05 LAB — TSH: TSH: 1.501 u[IU]/mL (ref 0.350–4.500)

## 2010-07-05 MED ORDER — LEVOTHYROXINE SODIUM 75 MCG PO TABS: 75.0000 ug | ORAL_TABLET | Freq: Every day | ORAL | Status: DC

## 2010-07-05 NOTE — Telephone Encounter (Signed)
Pt aware of the above  

## 2010-07-05 NOTE — Telephone Encounter (Signed)
Pls let pt know that her thryoid is perfect on current dose of meds.  I will RF her RX.

## 2010-07-12 NOTE — Telephone Encounter (Signed)
Is pt aware of this? 

## 2010-07-12 NOTE — Telephone Encounter (Signed)
Pt already paid for macrobid

## 2010-07-31 ENCOUNTER — Telehealth: Payer: Self-pay | Admitting: Family Medicine

## 2010-07-31 DIAGNOSIS — I10 Essential (primary) hypertension: Secondary | ICD-10-CM

## 2010-07-31 MED ORDER — LISINOPRIL-HYDROCHLOROTHIAZIDE 20-12.5 MG PO TABS: 1.0000 | ORAL_TABLET | Freq: Every day | ORAL | Status: DC

## 2010-07-31 NOTE — Telephone Encounter (Signed)
Pt called and would like Marcelino Duster to give her a call back.  Did Marcelino Duster fax refills for all the scripts or just one script????  Please have Marcelino Duster call me. Plan:  Pt notified and she needs a script for lisinopril -HCTZ 20-12-5 mg that she has not received.  Lat CPE on 03-16-10.  Sent a 30 day supply locally to [facility], and #90/1 refill to [facility] mail order.  Pt aware. Jarvis Newcomer, LPN [facility]

## 2010-10-03 ENCOUNTER — Telehealth: Payer: Self-pay | Admitting: Family Medicine

## 2010-10-03 NOTE — Telephone Encounter (Signed)
Wants Marcelino Duster to call her.  Didn't leave message as to what was needed.  Pt called and took an OTC urine test and it showed moderate leuks and + nitrate.  Wants Dr, Cathey Endow to rx her cipro . Plan:  Pt informed that we usually have the pt come in for nurse visit and produce a clean catch specimen.  Pt said she does not have transportation.  Told pt I'll send a note to the provider but usually we do nurse visit for this problem. Routed to Dr. Arlice Colt, LPN [facility]

## 2010-10-03 NOTE — Telephone Encounter (Signed)
As office policy, we cannot call her in antibiotics without being seen.  There are some means of transportation for elderly folks here -- ask front office for names if needed.

## 2010-10-04 ENCOUNTER — Ambulatory Visit (INDEPENDENT_AMBULATORY_CARE_PROVIDER_SITE_OTHER): Payer: 59 | Admitting: Family Medicine

## 2010-10-04 VITALS — BP 144/79 | HR 68 | Temp 98.0°F

## 2010-10-04 DIAGNOSIS — N390 Urinary tract infection, site not specified: Secondary | ICD-10-CM

## 2010-10-04 LAB — POCT URINALYSIS DIPSTICK
Bilirubin, UA: NEGATIVE
Glucose, UA: NEGATIVE
Nitrite, UA: NEGATIVE
Spec Grav, UA: 1.02

## 2010-10-04 MED ORDER — SULFAMETHOXAZOLE-TMP DS 800-160 MG PO TABS: 1.0000 | ORAL_TABLET | Freq: Two times a day (BID) | ORAL | Status: DC

## 2010-10-04 NOTE — Progress Notes (Signed)
Subjective:    Patient ID: Joanne Shaw, female    DOB: 07/23/1930, 77 y.o.   MRN: [id number]  HPI    Review of Systems     Objective:   Physical Exam        Assessment & Plan:  UTI- will tx. Call if not better in 3 days.

## 2010-10-04 NOTE — Telephone Encounter (Signed)
Pt informed that we do not call in antibiotic therapy over the phone and she was told need office visit. Pt said to set up for nurse visit today @ 3:00pm. Plan:  Nurse visit scheduled. Jarvis Newcomer, LPN [facility]

## 2010-10-05 ENCOUNTER — Telehealth: Payer: Self-pay | Admitting: *Deleted

## 2010-10-05 MED ORDER — CEPHALEXIN 500 MG PO CAPS: 500.0000 mg | ORAL_CAPSULE | Freq: Two times a day (BID) | ORAL | Status: DC

## 2010-10-05 NOTE — Telephone Encounter (Signed)
Pt states she is unable to swallow the Cipro bc the pills are too big. Please send something else for Pt.

## 2010-10-05 NOTE — Telephone Encounter (Signed)
Will change to keflex.

## 2010-10-08 ENCOUNTER — Telehealth: Payer: Self-pay | Admitting: *Deleted

## 2010-10-08 MED ORDER — NITROFURANTOIN MONOHYD MACRO 100 MG PO CAPS: 100.0000 mg | ORAL_CAPSULE | Freq: Two times a day (BID) | ORAL | Status: AC

## 2010-10-08 NOTE — Telephone Encounter (Signed)
Pt notified med sent to pharmacy. KJ LPN

## 2010-10-08 NOTE — Telephone Encounter (Signed)
Addended by: Nani Gasser D on: 10/08/2010 10:50 AM   Modules accepted: Orders

## 2010-10-08 NOTE — Telephone Encounter (Addendum)
OK, Will call in Macrobid.

## 2010-10-08 NOTE — Telephone Encounter (Signed)
meds you gave her for UTI made her really sick and would like something else sent to her pharmacy. Said she has used Macrobid in the past and this worked for her.

## 2010-10-18 ENCOUNTER — Other Ambulatory Visit: Payer: Self-pay | Admitting: *Deleted

## 2010-10-18 DIAGNOSIS — I10 Essential (primary) hypertension: Secondary | ICD-10-CM

## 2010-10-18 MED ORDER — METOPROLOL SUCCINATE ER 50 MG PO TB24: 50.0000 mg | ORAL_TABLET | Freq: Every day | ORAL | Status: DC

## 2010-10-18 MED ORDER — LISINOPRIL-HYDROCHLOROTHIAZIDE 20-12.5 MG PO TABS: 1.0000 | ORAL_TABLET | Freq: Every day | ORAL | Status: DC

## 2010-10-18 MED ORDER — SIMVASTATIN 80 MG PO TABS: 40.0000 mg | ORAL_TABLET | Freq: Every day | ORAL | Status: DC

## 2010-11-09 ENCOUNTER — Other Ambulatory Visit: Payer: Self-pay | Admitting: *Deleted

## 2010-11-09 MED ORDER — LEVOTHYROXINE SODIUM 75 MCG PO TABS: 75.0000 ug | ORAL_TABLET | Freq: Every day | ORAL | Status: DC

## 2011-03-19 ENCOUNTER — Ambulatory Visit: Payer: 59 | Admitting: Family Medicine

## 2011-03-21 ENCOUNTER — Encounter: Payer: Self-pay | Admitting: Family Medicine

## 2011-03-21 ENCOUNTER — Ambulatory Visit (INDEPENDENT_AMBULATORY_CARE_PROVIDER_SITE_OTHER): Payer: Medicare Other | Admitting: Family Medicine

## 2011-03-21 VITALS — BP 148/76 | HR 79 | Temp 97.8°F | Ht 62.0 in | Wt 125.0 lb

## 2011-03-21 DIAGNOSIS — E785 Hyperlipidemia, unspecified: Secondary | ICD-10-CM

## 2011-03-21 DIAGNOSIS — I10 Essential (primary) hypertension: Secondary | ICD-10-CM

## 2011-03-21 DIAGNOSIS — R300 Dysuria: Secondary | ICD-10-CM

## 2011-03-21 DIAGNOSIS — M25561 Pain in right knee: Secondary | ICD-10-CM

## 2011-03-21 DIAGNOSIS — M25569 Pain in unspecified knee: Secondary | ICD-10-CM

## 2011-03-21 DIAGNOSIS — Z23 Encounter for immunization: Secondary | ICD-10-CM

## 2011-03-21 DIAGNOSIS — S2231XD Fracture of one rib, right side, subsequent encounter for fracture with routine healing: Secondary | ICD-10-CM

## 2011-03-21 DIAGNOSIS — IMO0001 Reserved for inherently not codable concepts without codable children: Secondary | ICD-10-CM

## 2011-03-21 DIAGNOSIS — E039 Hypothyroidism, unspecified: Secondary | ICD-10-CM

## 2011-03-21 LAB — POCT URINALYSIS DIPSTICK
Bilirubin, UA: NEGATIVE
Glucose, UA: NEGATIVE
Ketones, UA: NEGATIVE
Nitrite, UA: NEGATIVE
Protein, UA: NEGATIVE
Spec Grav, UA: 1.02
Urobilinogen, UA: 0.2
pH, UA: 5.5

## 2011-03-21 MED ORDER — LEVOTHYROXINE SODIUM 75 MCG PO TABS: 75.0000 ug | ORAL_TABLET | Freq: Every day | ORAL | Status: DC

## 2011-03-21 MED ORDER — LISINOPRIL-HYDROCHLOROTHIAZIDE 20-12.5 MG PO TABS: 1.0000 | ORAL_TABLET | Freq: Every day | ORAL | Status: DC

## 2011-03-21 MED ORDER — SIMVASTATIN 80 MG PO TABS: 40.0000 mg | ORAL_TABLET | Freq: Every day | ORAL | Status: DC

## 2011-03-21 MED ORDER — METOPROLOL SUCCINATE ER 50 MG PO TB24: 50.0000 mg | ORAL_TABLET | Freq: Every day | ORAL | Status: DC

## 2011-03-21 NOTE — Patient Instructions (Signed)
Knee Pain The knee is the complex joint between your thigh and your lower leg. It is made up of bones, tendons, ligaments, and cartilage. The bones that make up the knee are:  The femur in the thigh.   The tibia and fibula in the lower leg.   The patella or kneecap riding in the groove on the lower femur.  CAUSES  Knee pain is a common complaint with many causes. A few of these causes are:  Injury, such as:   A ruptured ligament or tendon injury.   Torn cartilage.   Medical conditions, such as:   Gout   Arthritis   Infections   Overuse, over training or overdoing a physical activity.  Knee pain can be minor or severe. Knee pain can accompany debilitating injury. Minor knee problems often respond well to self-care measures or get well on their own. More serious injuries may need medical intervention or even surgery. SYMPTOMS The knee is complex. Symptoms of knee problems can vary widely. Some of the problems are:  Pain with movement and weight bearing.   Swelling and tenderness.   Buckling of the knee.   Inability to straighten or extend your knee.   Your knee locks and you cannot straighten it.   Warmth and redness with pain and fever.   Deformity or dislocation of the kneecap.  DIAGNOSIS  Determining what is wrong may be very straight forward such as when there is an injury. It can also be challenging because of the complexity of the knee. Tests to make a diagnosis may include:  Your caregiver taking a history and doing a physical exam.   Routine X-rays can be used to rule out other problems. X-rays will not reveal a cartilage tear. Some injuries of the knee can be diagnosed by:   Arthroscopy a surgical technique by which a small video camera is inserted through tiny incisions on the sides of the knee. This procedure is used to examine and repair internal knee joint problems. Tiny instruments can be used during arthroscopy to repair the torn knee cartilage  (meniscus).   Arthrography is a radiology technique. A contrast liquid is directly injected into the knee joint. Internal structures of the knee joint then become visible on X-ray film.   An MRI scan is a non x-ray radiology procedure in which magnetic fields and a computer produce two- or three-dimensional images of the inside of the knee. Cartilage tears are often visible using an MRI scanner. MRI scans have largely replaced arthrography in diagnosing cartilage tears of the knee.   Blood work.   Examination of the fluid that helps to lubricate the knee joint (synovial fluid). This is done by taking a sample out using a needle and a syringe.  TREATMENT The treatment of knee problems depends on the cause. Some of these treatments are:  Depending on the injury, proper casting, splinting, surgery or physical therapy care will be needed.   Give yourself adequate recovery time. Do not overuse your joints. If you begin to get sore during workout routines, back off. Slow down or do fewer repetitions.   For repetitive activities such as cycling or running, maintain your strength and nutrition.   Alternate muscle groups. For example if you are a weight lifter, work the upper body on one day and the lower body the next.   Either tight or weak muscles do not give the proper support for your knee. Tight or weak muscles do not absorb the stress placed   on the knee joint. Keep the muscles surrounding the knee strong.   Take care of mechanical problems.   If you have flat feet, orthotics or special shoes may help. See your caregiver if you need help.   Arch supports, sometimes with wedges on the inner or outer aspect of the heel, can help. These can shift pressure away from the side of the knee most bothered by osteoarthritis.   A brace called an "unloader" brace also may be used to help ease the pressure on the most arthritic side of the knee.   If your caregiver has prescribed crutches, braces,  wraps or ice, use as directed. The acronym for this is PRICE. This means protection, rest, ice, compression and elevation.   Nonsteroidal anti-inflammatory drugs (NSAID's), can help relieve pain. But if taken immediately after an injury, they may actually increase swelling. Take NSAID's with food in your stomach. Stop them if you develop stomach problems. Do not take these if you have a history of ulcers, stomach pain or bleeding from the bowel. Do not take without your caregiver's approval if you have problems with fluid retention, heart failure, or kidney problems.   For ongoing knee problems, physical therapy may be helpful.   Glucosamine and chondroitin are over-the-counter dietary supplements. Both may help relieve the pain of osteoarthritis in the knee. These medicines are different from the usual anti-inflammatory drugs. Glucosamine may decrease the rate of cartilage destruction.   Injections of a corticosteroid drug into your knee joint may help reduce the symptoms of an arthritis flare-up. They may provide pain relief that lasts a few months. You may have to wait a few months between injections. The injections do have a small increased risk of infection, water retention and elevated blood sugar levels.   Hyaluronic acid injected into damaged joints may ease pain and provide lubrication. These injections may work by reducing inflammation. A series of shots may give relief for as long as 6 months.   Topical painkillers. Applying certain ointments to your skin may help relieve the pain and stiffness of osteoarthritis. Ask your pharmacist for suggestions. Many over the-counter products are approved for temporary relief of arthritis pain.   In some countries, doctors often prescribe topical NSAID's for relief of chronic conditions such as arthritis and tendinitis. A review of treatment with NSAID creams found that they worked as well as oral medications but without the serious side effects.    PREVENTION  Maintain a healthy weight. Extra pounds put more strain on your joints.   Get strong, stay limber. Weak muscles are a common cause of knee injuries. Stretching is important. Include flexibility exercises in your workouts.   Be smart about exercise. If you have osteoarthritis, chronic knee pain or recurring injuries, you may need to change the way you exercise. This does not mean you have to stop being active. If your knees ache after jogging or playing basketball, consider switching to swimming, water aerobics or other low-impact activities, at least for a few days a week. Sometimes limiting high-impact activities will provide relief.   Make sure your shoes fit well. Choose footwear that is right for your sport.   Protect your knees. Use the proper gear for knee-sensitive activities. Use kneepads when playing volleyball or laying carpet. Buckle your seat belt every time you drive. Most shattered kneecaps occur in car accidents.   Rest when you are tired.  SEEK MEDICAL CARE IF:  You have knee pain that is continual and does not   seem to be getting better.  SEEK IMMEDIATE MEDICAL CARE IF:  Your knee joint feels hot to the touch and you have a high fever. MAKE SURE YOU:   Understand these instructions.   Will watch your condition.   Will get help right away if you are not doing well or get worse.  Document Released: 12/16/2006 Document Revised: 10/31/2010 Document Reviewed: 12/16/2006 [facility] Patient Information 2012 [place], [place].Rib Fracture Your caregiver has diagnosed you as having a rib fracture (a break). This can occur by a blow to the chest, by a fall against a hard object, or by violent coughing or sneezing. There may be one or many breaks. Rib fractures may heal on their own within 3 to 8 weeks. The longer healing period is usually associated with a continued cough or other aggravating activities. HOME CARE INSTRUCTIONS   Avoid strenuous activity. Be careful during  activities and avoid bumping the injured rib. Activities that cause pain pull on the fracture site(s) and are best avoided if possible.   Eat a normal, well-balanced diet. Drink plenty of fluids to avoid constipation.   Take deep breaths several times a day to keep lungs free of infection. Try to cough several times a day, splinting the injured area with a pillow. This will help prevent pneumonia.   Do not wear a rib belt or binder. These restrict breathing which can lead to pneumonia.   Only take over-the-counter or prescription medicines for pain, discomfort, or fever as directed by your caregiver.  SEEK MEDICAL CARE IF:  You develop a continual cough, associated with thick or bloody sputum. SEEK IMMEDIATE MEDICAL CARE IF:   You have a fever.   You have difficulty breathing.   You have nausea (feeling sick to your stomach), vomiting, or abdominal (belly) pain.   You have worsening pain, not controlled with medications.  Document Released: 02/18/2005 Document Revised: 10/31/2010 Document Reviewed: 07/23/2006 [facility] Patient Information 2012 [place], [place].

## 2011-03-23 NOTE — Progress Notes (Signed)
Subjective:    Patient ID: Joanne Shaw, female    DOB: [birth year], 78 y.o.   MRN: [id number]  HPI Patient w/ multiple problems  #1 She broke her ribs In early December was seen at [facility] in [place] and admitted. She stayed for several days but not clear why. #2 Dysuria #3 Hypertension #4 Right knee pain she wants something done for her R knee but no surgery #5 immunization update #6 hyperlipidemia  Review of Systems    BP 148/76  Pulse 79  Temp(Src) 97.8 F (36.6 C) (Oral)  Ht 5\' 2"  (1.575 m)  Wt 125 lb (56.7 kg)  BMI 22.86 kg/m2  SpO2 99% Objective:   Physical Exam Elderly WF thin ,Lung clear CVS S1 & S2  No signs of splinting when breathing. R knee w/ostearthritic changes present Due to hospitalizations records were obtained from [facility], [place] patient developed an ileus from the pain medication and was sent to a Nursing home for rehab. Apparently no lipid panels were found in review of her chart      Results for orders placed in visit on 03/21/11  POCT URINALYSIS DIPSTICK      Component Value Range   Color, UA yellow     Clarity, UA cloudy     Glucose, UA neg     Bilirubin, UA neg     Ketones, UA neg     Spec Grav, UA 1.020     Blood, UA trace-intact     pH, UA 5.5     Protein, UA neg     Urobilinogen, UA 0.2     Nitrite, UA neg     Leukocytes, UA moderate (2+)     Assessment & Plan:  #1 Rib fracture. Repeat xray to check on healing #2Dysuria will need to treat UTI #3 Hypertension continue on Toprol and lisinopril  #4 will obtain orthopedic referral explain that there may be injections like the hyaluronic acid #5 Immunization records reviewed  And will give flu vaccination.  #6 hyperlipidemia initial was checking hospital records before we order lipid panel none seen so when she returns in 4 months will need to get lipid panel at that time.

## 2011-03-27 MED ORDER — NITROFURANTOIN MONOHYD MACRO 100 MG PO CAPS: 100.0000 mg | ORAL_CAPSULE | Freq: Two times a day (BID) | ORAL | Status: DC

## 2011-03-27 NOTE — Progress Notes (Signed)
Addended by: Ellsworth Lennox on: 03/27/2011 08:52 AM   Modules accepted: Orders

## 2011-04-02 ENCOUNTER — Ambulatory Visit: Payer: 59 | Admitting: Family Medicine

## 2011-09-24 ENCOUNTER — Encounter: Payer: Self-pay | Admitting: *Deleted

## 2011-09-24 ENCOUNTER — Emergency Department (INDEPENDENT_AMBULATORY_CARE_PROVIDER_SITE_OTHER)
Admission: EM | Admit: 2011-09-24 | Discharge: 2011-09-24 | Disposition: A | Discharge status: Home / Self Care | Payer: Medicare Other | Source: Home / Self Care | Attending: Family Medicine | Admitting: Family Medicine

## 2011-09-24 DIAGNOSIS — M79609 Pain in unspecified limb: Secondary | ICD-10-CM

## 2011-09-24 DIAGNOSIS — M7989 Other specified soft tissue disorders: Secondary | ICD-10-CM

## 2011-09-24 DIAGNOSIS — M79641 Pain in right hand: Secondary | ICD-10-CM

## 2011-09-24 LAB — POCT CBC W AUTO DIFF (K'VILLE URGENT CARE)

## 2011-09-24 MED ORDER — DOXYCYCLINE HYCLATE 100 MG PO CAPS: 100.0000 mg | ORAL_CAPSULE | Freq: Two times a day (BID) | ORAL | Status: AC

## 2011-09-24 NOTE — ED Notes (Signed)
Patient presents with right index finger edema, and erythema spreading into her hand. The site is warm to touch. She also reports pain at the site. There are no bite marks nor does she report any injury.

## 2011-09-24 NOTE — ED Provider Notes (Signed)
History     CSN: [id number]  Arrival date & time 09/24/11  1434   First MD Initiated Contact with Patient 09/24/11 1436      No chief complaint on file.    HPI  Past Medical History  Diagnosis Date  . Hyperlipidemia   . Hypertension   . Hypothyroidism   . Occipital neuralgia     30 yrs ago  . Osteopenia   . Right knee DJD   . Cancer 11-11    right breast (Dr Kathaleen Grinder)    Past Surgical History  Procedure Date  . Tubal ligation   . Appendectomy   . Breast surgery 2-12    right breast segmental mastectomy- Dr Lafayette Dragon    No family history on file.  History  Substance Use Topics  . Smoking status: Former Smoker    Quit date: 03/04/1998  . Smokeless tobacco: Not on file  . Alcohol Use: 4.2 oz/week    7 Glasses of wine per week     with dinner    OB History    Grav Para Term Preterm Abortions TAB SAB Ect Mult Living                  Review of Systems  Allergies  Codeine sulfate and Penicillins  Home Medications   Current Outpatient Rx  Name Route Sig Dispense Refill  . ASPIRIN 81 MG PO TABS Oral Take 81 mg by mouth daily.      Marland Kitchen VITAMIN D 2000 UNITS PO CAPS Oral Take 1 capsule by mouth daily.      Marland Kitchen LEVOTHYROXINE SODIUM 75 MCG PO TABS Oral Take 1 tablet (75 mcg total) by mouth daily. 30 tablet 11  . LISINOPRIL-HYDROCHLOROTHIAZIDE 20-12.5 MG PO TABS Oral Take 1 tablet by mouth daily. 30 tablet 11  . METOPROLOL SUCCINATE ER 50 MG PO TB24 Oral Take 1 tablet (50 mg total) by mouth daily. 30 tablet 11  . NITROFURANTOIN MONOHYD MACRO 100 MG PO CAPS Oral Take 1 capsule (100 mg total) by mouth 2 (two) times daily. 20 capsule 0  . SIMVASTATIN 80 MG PO TABS Oral Take 0.5 tablets (40 mg total) by mouth at bedtime. 30 tablet 11    There were no vitals taken for this visit.  Physical Exam  ED Course  Procedures    Labs Reviewed - No data to display No results found.   No diagnosis found.    MDM    See previous note       Lattie Haw,  MD 09/24/11 [phone]

## 2011-09-24 NOTE — ED Provider Notes (Signed)
Agree with exam, assessment, and plan.   Lattie Haw, MD 09/24/11 [phone]

## 2011-09-24 NOTE — ED Provider Notes (Signed)
History     CSN: [id number]  Arrival date & time 09/24/11  1434   First MD Initiated Contact with Patient 09/24/11 1436      Chief Complaint  Patient presents with  . Hand Pain    right index finger with redness and edema    HPI Comments: Patient presents with right index finger edema, and erythema spreading into her hand.  Started 1 day ago. The site is warm to touch. She also reports pain at the site rating at 7/10 when arm is hanging downward.  Pain resolution with elevation of arm.  She has not taken any OTC medications for pain management. There are no bite marks nor does she report any injury.   She does relay having a history of osteoarthritis.  No past history of Gout.  The history is provided by the patient.    Past Medical History  Diagnosis Date  . Hyperlipidemia   . Hypertension   . Hypothyroidism   . Occipital neuralgia     30 yrs ago  . Osteopenia   . Right knee DJD   . Cancer 11-11    right breast (Dr Kathaleen Grinder)    Past Surgical History  Procedure Date  . Tubal ligation   . Appendectomy   . Breast surgery 2-12    right breast segmental mastectomy- Dr Lafayette Dragon    History reviewed. No pertinent family history.  History  Substance Use Topics  . Smoking status: Former Smoker    Quit date: 03/04/1998  . Smokeless tobacco: Not on file  . Alcohol Use: 4.2 oz/week    7 Glasses of wine per week     with dinner    OB History    Grav Para Term Preterm Abortions TAB SAB Ect Mult Living                  Review of Systems  Constitutional: Negative for fever and chills.  Musculoskeletal:       Right 2nd, 3rd finger edema with erythema and pain.  Warm to touch.   Neurological: Negative.   All other systems reviewed and are negative.    Allergies  Codeine sulfate and Penicillins  Home Medications   Current Outpatient Rx  Name Route Sig Dispense Refill  . ASPIRIN 81 MG PO TABS Oral Take 81 mg by mouth daily.      Marland Kitchen VITAMIN D 2000 UNITS PO CAPS Oral  Take 1 capsule by mouth daily.      Marland Kitchen DOXYCYCLINE HYCLATE 100 MG PO CAPS Oral Take 1 capsule (100 mg total) by mouth 2 (two) times daily. 14 capsule 0  . LEVOTHYROXINE SODIUM 75 MCG PO TABS Oral Take 1 tablet (75 mcg total) by mouth daily. 30 tablet 11  . LISINOPRIL-HYDROCHLOROTHIAZIDE 20-12.5 MG PO TABS Oral Take 1 tablet by mouth daily. 30 tablet 11  . METOPROLOL SUCCINATE ER 50 MG PO TB24 Oral Take 1 tablet (50 mg total) by mouth daily. 30 tablet 11  . NITROFURANTOIN MONOHYD MACRO 100 MG PO CAPS Oral Take 1 capsule (100 mg total) by mouth 2 (two) times daily. 20 capsule 0  . SIMVASTATIN 80 MG PO TABS Oral Take 0.5 tablets (40 mg total) by mouth at bedtime. 30 tablet 11    BP 157/86  Pulse 81  Temp 98.5 F (36.9 C) (Oral)  Resp 14  Ht 5\' 4"  (1.626 m)  Wt 129 lb (58.514 kg)  BMI 22.14 kg/m2  SpO2 96%  Physical Exam  Nursing note  and vitals reviewed. Constitutional: She appears well-developed and well-nourished. No distress.  HENT:  Head: Normocephalic and atraumatic.  Musculoskeletal:       Right hand and 2nd and 3rd digit edema, erythema, and warm to touch.  Tender to palpation over the 2 digit DIP, PIP, and MCP. Tender to palpation over the 3rd digit PIP, MCP Tender to palpation over the 5th digit MCP.Distal neurovascular system intact.     ED Course  Procedures (including critical care time)   Labs Reviewed  POCT CBC W AUTO DIFF ([facility]) WBC - 11.5 Lymphocytes - 20.6 Monocytes - 6.4 Granulocytes -73.0 RBC - 3.47 HBG - 11.3 HCT - 34.1 PLT - 273   URIC ACID - pending     1. Hand pain, right   2. Hand swelling       MDM  Serum uric acid pending. With elevation in leukocytes there is a possibility of cellulitis.  Rx for Doxycycline 100 mg twice a day for 7 days given.   May take Ibuprofen 600-800 mg three times a day for pain.  Take with food. Please return to clinic in 48-72 hours to recheck CBC for white blood count.   If symptoms worsen  please seek help at the local urgent care or ER.        Junius Roads, NP 09/24/11 1559

## 2011-09-25 LAB — URIC ACID: Uric Acid, Serum: 7.8 mg/dL — ABNORMAL HIGH (ref 2.4–7.0)

## 2011-09-26 ENCOUNTER — Telehealth: Payer: Self-pay | Admitting: Emergency Medicine

## 2011-09-27 ENCOUNTER — Encounter: Payer: Self-pay | Admitting: Emergency Medicine

## 2011-09-27 ENCOUNTER — Emergency Department (INDEPENDENT_AMBULATORY_CARE_PROVIDER_SITE_OTHER)
Admission: EM | Admit: 2011-09-27 | Discharge: 2011-09-27 | Disposition: A | Discharge status: Home / Self Care | Payer: Medicare Other | Source: Home / Self Care

## 2011-09-27 DIAGNOSIS — M79609 Pain in unspecified limb: Secondary | ICD-10-CM

## 2011-09-27 LAB — POCT CBC W AUTO DIFF (K'VILLE URGENT CARE)

## 2011-09-27 NOTE — ED Notes (Signed)
CBC

## 2011-10-03 ENCOUNTER — Encounter: Payer: Self-pay | Admitting: Family Medicine

## 2011-10-03 ENCOUNTER — Ambulatory Visit (INDEPENDENT_AMBULATORY_CARE_PROVIDER_SITE_OTHER): Payer: Medicare Other | Admitting: Family Medicine

## 2011-10-03 ENCOUNTER — Other Ambulatory Visit: Payer: Self-pay | Admitting: Family Medicine

## 2011-10-03 VITALS — BP 158/77 | HR 61 | Ht 62.0 in | Wt 126.0 lb

## 2011-10-03 DIAGNOSIS — R21 Rash and other nonspecific skin eruption: Secondary | ICD-10-CM

## 2011-10-03 DIAGNOSIS — Z853 Personal history of malignant neoplasm of breast: Secondary | ICD-10-CM

## 2011-10-03 DIAGNOSIS — R030 Elevated blood-pressure reading, without diagnosis of hypertension: Secondary | ICD-10-CM

## 2011-10-03 DIAGNOSIS — L03019 Cellulitis of unspecified finger: Secondary | ICD-10-CM

## 2011-10-03 NOTE — Patient Instructions (Addendum)
We will call you with the skin scraping results Keep an eye on your finger and make sure continues to get better.

## 2011-10-03 NOTE — Progress Notes (Signed)
Subjective:    Patient ID: Joanne Shaw, female    DOB: September 12, 1930, 78 y.o.   MRN: [id number]  HPI Had finger pain about 8 days ago and was red and swollen. No truama, injury, or bony. Placed on doxy. Completed course. Went back 5 days ago for repeat CBC.  It was normal per chart.  Also noticed red spot on her upper left arm that is red. No pain, swelling, itching or discomfort.  No fever.       Review of Systems     Objective:   Physical Exam  Constitutional: She appears well-developed and well-nourished.  HENT:  Head: Normocephalic and atraumatic.  Musculoskeletal:       Right hand, index finger with still some mild swelling but erythema is much improved.   Skin:       Erythematous well demarcated macular ciruclar rash. Smooth. 15mm in diameter.           Assessment & Plan:  Finger cellulits healing very well. She's completed her course of antibiotic and her blood count, particularly her white count was back down to normal. I encouraged her to keep an eye on it each day she continued to look better. If it certainly looks worse or red or swollen or painful again and she needs to call back immediately.  Rash on left upper arm - unclear etiology-KOH skin scraping was collected, to rule out fungal elements. If this is negative then consider treatment with topical steroid cream. It does look somewhat like ringworm but it does not have central clearing. Though the border is more erythematous. Also consider granuloma annulare, they're usually not solitary lesion on the upper extremity. If the scraping is negative then we'll treat with steroid cream. If that is not improving then consider biopsy for further evaluation. Also consider sarcoid.    Hx of breast Ca - Had to cancel breast bx this week bc of finger infection. I do think her infection has cleared and she is okay to reschedule her biopsy next week.

## 2011-10-07 ENCOUNTER — Other Ambulatory Visit: Payer: Self-pay | Admitting: Family Medicine

## 2011-10-07 MED ORDER — TRIAMCINOLONE ACETONIDE 0.5 % EX OINT: TOPICAL_OINTMENT | Freq: Every day | CUTANEOUS | Status: AC

## 2011-11-20 ENCOUNTER — Encounter: Payer: Self-pay | Admitting: *Deleted

## 2011-11-20 ENCOUNTER — Encounter: Payer: Self-pay | Admitting: Sports Medicine

## 2011-11-20 ENCOUNTER — Ambulatory Visit (INDEPENDENT_AMBULATORY_CARE_PROVIDER_SITE_OTHER): Payer: Medicare Other | Admitting: Sports Medicine

## 2011-11-20 ENCOUNTER — Ambulatory Visit (INDEPENDENT_AMBULATORY_CARE_PROVIDER_SITE_OTHER): Payer: Medicare Other

## 2011-11-20 VITALS — BP 126/82 | HR 68 | Temp 97.4°F | Resp 18 | Wt 129.0 lb

## 2011-11-20 DIAGNOSIS — M79642 Pain in left hand: Secondary | ICD-10-CM | POA: Insufficient documentation

## 2011-11-20 DIAGNOSIS — M79609 Pain in unspecified limb: Secondary | ICD-10-CM

## 2011-11-20 DIAGNOSIS — IMO0002 Reserved for concepts with insufficient information to code with codable children: Secondary | ICD-10-CM

## 2011-11-20 DIAGNOSIS — M899 Disorder of bone, unspecified: Secondary | ICD-10-CM

## 2011-11-20 DIAGNOSIS — M1710 Unilateral primary osteoarthritis, unspecified knee: Secondary | ICD-10-CM

## 2011-11-20 DIAGNOSIS — M949 Disorder of cartilage, unspecified: Secondary | ICD-10-CM

## 2011-11-20 DIAGNOSIS — M7989 Other specified soft tissue disorders: Secondary | ICD-10-CM

## 2011-11-20 DIAGNOSIS — M25549 Pain in joints of unspecified hand: Secondary | ICD-10-CM

## 2011-11-20 MED ORDER — DOXYCYCLINE HYCLATE 100 MG PO TABS: 100.0000 mg | ORAL_TABLET | Freq: Two times a day (BID) | ORAL | Status: AC

## 2011-11-20 MED ORDER — MELOXICAM 15 MG PO TABS: ORAL_TABLET | ORAL | Status: DC

## 2011-11-20 NOTE — Assessment & Plan Note (Signed)
I performed an ultrasound guided injection as above. Resolution of pain suggests after placement of the medication. Knee sleeve. Mobic daily. She'll come back to see me in 3-4 weeks for this. Should her pain not be improved, we can consider starting a series of Visco supplementation. This does not need preapproval as she is Medicare.

## 2011-11-20 NOTE — Assessment & Plan Note (Signed)
Ultrasound guided injection as above. This did not appear to be a septic joint. She will do a course of doxycycline despite this. She will continue to ice 20 minutes 3-4 times a day. She'll come back to see me in 4 weeks or sooner if worsening.

## 2011-11-20 NOTE — Progress Notes (Signed)
Subjective:    I'm seeing this patient as a consultation for:  Dr. Linford Arnold  CC: Right knee pain, left hand pain.  HPI: Joanne Shaw is a very pleasant 78 year old female with long history of right knee, and a several day history of left hand pain.  Left hand: Painful left the fifth metacarpophalangeal joint, associated with swelling, and pain that does not radiate. She denies any trauma, denies any bites, and denies any breaks in the skin. She's been using some Tylenol which is only moderately effective.  She denies any fevers, or chills.  Right knee pain: She does carry a diagnosis of mild DJD. She's been to an orthopedist a couple months ago. An injection was performed but she noted no benefit, not even on the day of injection. There is no further contact with her and the orthopedist since then. Her pain is localized along the lateral joint line, there is no swelling, and she denies any mechanical symptoms. The pain does not radiate, and she denies any trauma. Again, she's been using Tylenol, which is moderately effective.  Past medical history, Surgical history, Family history, Social history, Allergies, and medications have been entered into the medical record, reviewed, and no changes needed.   Review of Systems: No headache, visual changes, nausea, vomiting, diarrhea, constipation, dizziness, abdominal pain, skin rash, fevers, chills, night sweats, weight loss, body aches, joint swelling, muscle aches, chest pain, or shortness of breath.   Objective:   Vitals:  Afebrile, vital signs stable. General: Well Developed, well nourished, and in no acute distress.  Neuro/Psych: Alert and oriented x3, extra-ocular muscles intact, able to move all 4 extremities.  Skin: Warm and dry, no rashes noted.  Respiratory: Not using accessory muscles, speaking in full sentences, trachea midline.  Cardiovascular: Pulses palpable, no extremity edema. Abdomen: Does not appear distended. Right Knee: Normal to  inspection with no erythema or effusion or obvious bony abnormalities. Exquisitely tender to palpation along the lateral joint line. ROM full in flexion and extension and lower leg rotation. Ligaments with solid consistent endpoints including ACL, PCL, LCL, MCL. Negative Mcmurray's, Apley's, and Thessalonian tests. Non painful patellar compression. Patellar glide with crepitus. Patellar and quadriceps tendons unremarkable. Hamstring and quadriceps strength is normal.   Left hand: There significant swelling over the fifth digit. Pain is localized at the fifth metacarpophalangeal joint. Range of motion is otherwise full, and strength is 5 out of 5 to flexion and extension at the MCP, PIP, and DIP joints.  All collateral ligamentous structures are stable.  I obtained x-rays of the knee, these were interpreted by myself.  The right knee shows chondrocalcinosis, as well as mild joint space narrowing in the medial tibiofemoral compartment. The patellofemoral compartment is preserved. Interestingly, her left knee shows worse medial tibiofemoral compartment joint space narrowing on the [place] view.  The left hand shows degenerative changes at the MCP, PIP, and DIP joints. There is no sign of fracture, or dislocation. There's also some radiopaque foreign body.  Procedure: Real-time Ultrasound Guided Injection of right knee Device: GE Logiq E  Ultrasound guided injection is preferred based studies that show increased duration, increased effect, greater accuracy, decreased procedural pain, increased response rate, and decreased cost with ultrasound guided versus blind injection.  Verbal informed consent obtained.  Time-out conducted.  Noted no overlying erythema, induration, or other signs of local infection.  Skin prepped in a sterile fashion.  Local anesthesia: Topical Ethyl chloride.  With sterile technique and under real time ultrasound guidance:  25-gauge needle  advanced in the suprapatellar  recess under real-time ultrasound guidance. 2 cc of July 40, 4 cc lidocaine injected easily. Suprapatellar recess seen distending. Completed without difficulty  Pain immediately resolved suggesting accurate placement of the medication.  Advised to call if fevers/chills, erythema, induration, drainage, or persistent bleeding.  Images permanently stored and available for review in the ultrasound unit.  Impression: Technically successful ultrasound guided injection.  Procedure: Real-time Ultrasound Guided Injection of left fifth metacarpophalangeal joint Device: GE Logiq E  Ultrasound guided injection is preferred based studies that show increased duration, increased effect, greater accuracy, decreased procedural pain, increased response rate, and decreased cost with ultrasound guided versus blind injection.  Verbal informed consent obtained.  Time-out conducted.  Noted no overlying erythema, induration, or other signs of local infection.  Skin prepped in a sterile fashion.  Local anesthesia: Topical Ethyl chloride.  With sterile technique and under real time ultrasound guidance:  I images the MCP, there was no joint effusion, there was some synovial thickening suggestive of a synovitis. I advanced the needle in the short axis into the joint, at that point, I injected 1/2 cc of Kenalog 40, 2 cc of lidocaine, the joint was seen distending under real-time ultrasound guidance. Completed without difficulty  Pain immediately resolved suggesting accurate placement of the medication.  Advised to call if fevers/chills, erythema, induration, drainage, or persistent bleeding.  Images permanently stored and available for review in the ultrasound unit.  Impression: Technically successful ultrasound guided injection.   Impression and Recommendations:   This case required medical decision making of moderate complexity.

## 2011-12-10 ENCOUNTER — Ambulatory Visit (INDEPENDENT_AMBULATORY_CARE_PROVIDER_SITE_OTHER): Payer: Medicare Other | Admitting: Sports Medicine

## 2011-12-10 ENCOUNTER — Encounter: Payer: Self-pay | Admitting: *Deleted

## 2011-12-10 ENCOUNTER — Encounter: Payer: Self-pay | Admitting: Sports Medicine

## 2011-12-10 VITALS — BP 142/70 | HR 74 | Wt 128.0 lb

## 2011-12-10 DIAGNOSIS — IMO0002 Reserved for concepts with insufficient information to code with codable children: Secondary | ICD-10-CM

## 2011-12-10 DIAGNOSIS — M1710 Unilateral primary osteoarthritis, unspecified knee: Secondary | ICD-10-CM

## 2011-12-10 DIAGNOSIS — M79642 Pain in left hand: Secondary | ICD-10-CM

## 2011-12-10 NOTE — Assessment & Plan Note (Signed)
Pain was 100% resolved for approximately 3 weeks, starting to come back although minimal. Repeat injection per patient request, she understands that should her pain come back prior to 3 months we will be starting the Viscosupplementation procedure. She is Medicare, so no prior approval needed.

## 2011-12-10 NOTE — Assessment & Plan Note (Signed)
Completely resolved status post a single intra-articular injection.

## 2011-12-10 NOTE — Progress Notes (Signed)
Subjective:    CC: Followup injections  HPI:  Left fifth metacarpophalangeal joint: Pain, and swelling completely resolved one month status post intra-articular injection.  Right knee DJD with chondrocalcinosis: Pain was completely resolved for 3 weeks, slightly worse, desires repeat injection.   Past medical history, Surgical history, Family history, Social history, Allergies, and medications have been entered into the medical record, reviewed, and no changes needed.   Review of Systems: No fevers, chills, night sweats, weight loss, chest pain, or shortness of breath.   Objective:    General: Well Developed, well nourished, and in no acute distress.  Procedure: Real-time Ultrasound Guided Injection of right tibiofemoral joint Device: GE Logiq E  Ultrasound guided injection is preferred based studies that show increased duration, increased effect, greater accuracy, decreased procedural pain, increased response rate, and decreased cost with ultrasound guided versus blind injection.  Verbal informed consent obtained.  Time-out conducted.  Noted no overlying erythema, induration, or other signs of local infection.  Skin prepped in a sterile fashion.  Local anesthesia: Topical Ethyl chloride.  With sterile technique and under real time ultrasound guidance:  Needle advanced in the suprapatellar recess, 2 cc Kenalog 40, 4 cc lidocaine injected without resistance. Completed without difficulty  Pain immediately resolved suggesting accurate placement of the medication.  Advised to call if fevers/chills, erythema, induration, drainage, or persistent bleeding.  Images permanently stored and available for review in the ultrasound unit.  Impression: Technically successful ultrasound guided injection.  Impression and Recommendations:

## 2011-12-25 ENCOUNTER — Ambulatory Visit: Payer: Medicare Other | Admitting: Sports Medicine

## 2012-01-07 ENCOUNTER — Ambulatory Visit: Payer: Medicare Other | Admitting: Sports Medicine

## 2012-01-09 ENCOUNTER — Encounter: Payer: Self-pay | Admitting: Sports Medicine

## 2012-01-09 ENCOUNTER — Ambulatory Visit (INDEPENDENT_AMBULATORY_CARE_PROVIDER_SITE_OTHER): Payer: Medicare Other | Admitting: Sports Medicine

## 2012-01-09 VITALS — BP 131/52 | HR 98 | Wt 130.0 lb

## 2012-01-09 DIAGNOSIS — M79642 Pain in left hand: Secondary | ICD-10-CM

## 2012-01-09 DIAGNOSIS — IMO0002 Reserved for concepts with insufficient information to code with codable children: Secondary | ICD-10-CM

## 2012-01-09 DIAGNOSIS — M1710 Unilateral primary osteoarthritis, unspecified knee: Secondary | ICD-10-CM

## 2012-01-09 NOTE — Progress Notes (Signed)
SPORTS MEDICINE CONSULTATION REPORT  Subjective:    CC: Knee pain  HPI:  Right knee pain: Joanne Shaw is very pleasant, she comes back to further discuss her right knee pain. She has known degenerative joint disease, and has done moderately well with 2 intra-articular steroid injections, unfortunately they have only lasted about a month each. Her pain is in both joint lines, does not radiate, and is not associated with mechanical symptoms. She does desire to start viscous supplementation today.  Left fifth metacarpophalangeal joint synovitis: continues to be resolved 2 months after injection.  Past medical history, Surgical history, Family history, Social history, Allergies, and medications have been entered into the medical record, reviewed, and no changes needed.   Review of Systems: No headache, visual changes, nausea, vomiting, diarrhea, constipation, dizziness, abdominal pain, skin rash, fevers, chills, night sweats, weight loss, swollen lymph nodes, body aches, joint swelling, muscle aches, chest pain, or shortness of breath.   Objective:   Vitals:  Afebrile, vital signs stable. General: Well Developed, well nourished, and in no acute distress.  Neuro/Psych: Alert and oriented x3, extra-ocular muscles intact, able to move all 4 extremities.  Skin: Warm and dry, no rashes noted.  Respiratory: Not using accessory muscles, speaking in full sentences, trachea midline.  Cardiovascular: Pulses palpable, no extremity edema. Abdomen: Does not appear distended. Right Knee: Normal to inspection with no erythema or effusion or obvious bony abnormalities. Palpation normal with no warmth, joint line tenderness, patellar tenderness, or condyle tenderness. ROM full in flexion and extension and lower leg rotation. Ligaments with solid consistent endpoints including ACL, PCL, LCL, MCL. Negative Mcmurray's, Apley's, and Thessalonian tests. Non painful patellar compression. Patellar glide without  crepitus. Patellar and quadriceps tendons unremarkable. Hamstring and quadriceps strength is normal.   Procedure: Real-time Ultrasound Guided Injection of  right knee viscous supplementation. Device: GE Logiq E  Ultrasound guided injection is preferred based studies that show increased duration, increased effect, greater accuracy, decreased procedural pain, increased response rate, and decreased cost with ultrasound guided versus blind injection.  Verbal informed consent obtained.  Time-out conducted.  Noted no overlying erythema, induration, or other signs of local infection.  Skin prepped in a sterile fashion.  Local anesthesia: Topical Ethyl chloride.  With sterile technique and under real time ultrasound guidance:  25 mg/2.5 mL of Supartz (sodium hyaluronate) in a prefilled syringe was injected easily into the knee through a 22-gauge needle. Completed without difficulty  Pain immediately resolved suggesting accurate placement of the medication.  Advised to call if fevers/chills, erythema, induration, drainage, or persistent bleeding.  Images permanently stored and available for review in the ultrasound unit.  Impression: Technically successful ultrasound guided injection.  Impression and Recommendations:   This case required medical decision making of moderate complexity.

## 2012-01-09 NOTE — Patient Instructions (Addendum)
We are doing viscosupplementation for knee osteoarthritis with Supartz.

## 2012-01-09 NOTE — Assessment & Plan Note (Addendum)
Supartz #1 of 5 done today. Return to clinic for #2 of 5 in one week.  She will let us know if her local physician can do these injections.

## 2012-01-09 NOTE — Assessment & Plan Note (Signed)
Continues to be resolved 2 months after intra-articular injection.

## 2012-01-16 ENCOUNTER — Encounter: Payer: Self-pay | Admitting: Sports Medicine

## 2012-01-16 ENCOUNTER — Ambulatory Visit (INDEPENDENT_AMBULATORY_CARE_PROVIDER_SITE_OTHER): Payer: Medicare Other | Admitting: Sports Medicine

## 2012-01-16 VITALS — BP 153/72 | HR 72 | Wt 133.0 lb

## 2012-01-16 DIAGNOSIS — M1710 Unilateral primary osteoarthritis, unspecified knee: Secondary | ICD-10-CM

## 2012-01-16 DIAGNOSIS — IMO0002 Reserved for concepts with insufficient information to code with codable children: Secondary | ICD-10-CM

## 2012-01-16 NOTE — Assessment & Plan Note (Signed)
Supartz #2 of 5 done today. Return to clinic for #3 of 5 in one week.

## 2012-01-16 NOTE — Progress Notes (Signed)
SPORTS MEDICINE CONSULTATION REPORT  Subjective:    CC: Knee pain  HPI:  Right knee pain: Joanne Shaw is very pleasant, she comes back to further discuss her right knee pain. She has known degenerative joint disease, and has done moderately well with 2 intra-articular steroid injections, unfortunately they have only lasted about a month each. Her pain is in both joint lines, does not radiate, and is not associated with mechanical symptoms. She is here for Supartz injection #2.  Past medical history, Surgical history, Family history, Social history, Allergies, and medications have been entered into the medical record, reviewed, and no changes needed.   Review of Systems: No headache, visual changes, nausea, vomiting, diarrhea, constipation, dizziness, abdominal pain, skin rash, fevers, chills, night sweats, weight loss, swollen lymph nodes, body aches, joint swelling, muscle aches, chest pain, or shortness of breath.   Objective:   Vitals:  Afebrile, vital signs stable. General: Well Developed, well nourished, and in no acute distress.   Right Knee: Normal to inspection with no erythema or effusion or obvious bony abnormalities. Palpation normal with no warmth, joint line tenderness, patellar tenderness, or condyle tenderness. ROM full in flexion and extension and lower leg rotation. Ligaments with solid consistent endpoints including ACL, PCL, LCL, MCL. Negative Mcmurray's, Apley's, and Thessalonian tests. Non painful patellar compression. Patellar glide without crepitus. Patellar and quadriceps tendons unremarkable. Hamstring and quadriceps strength is normal.   Procedure: Real-time Ultrasound Guided Injection of  right knee viscous supplementation. Device: GE Logiq E  Ultrasound guided injection is preferred based studies that show increased duration, increased effect, greater accuracy, decreased procedural pain, increased response rate, and decreased cost with ultrasound guided versus  blind injection.  Verbal informed consent obtained.  Time-out conducted.  Noted no overlying erythema, induration, or other signs of local infection.  Skin prepped in a sterile fashion.  Local anesthesia: Topical Ethyl chloride.  With sterile technique and under real time ultrasound guidance:  25 mg/2.5 mL of Supartz (sodium hyaluronate) in a prefilled syringe was injected easily into the knee through a 22-gauge needle. Completed without difficulty  Pain immediately resolved suggesting accurate placement of the medication.  Advised to call if fevers/chills, erythema, induration, drainage, or persistent bleeding.  Images permanently stored and available for review in the ultrasound unit.  Impression: Technically successful ultrasound guided injection.  Impression and Recommendations:   This case required medical decision making of moderate complexity.

## 2012-01-23 ENCOUNTER — Encounter: Payer: Self-pay | Admitting: Sports Medicine

## 2012-01-23 ENCOUNTER — Ambulatory Visit (INDEPENDENT_AMBULATORY_CARE_PROVIDER_SITE_OTHER): Payer: Medicare Other | Admitting: Sports Medicine

## 2012-01-23 VITALS — BP 145/58 | HR 76

## 2012-01-23 DIAGNOSIS — IMO0002 Reserved for concepts with insufficient information to code with codable children: Secondary | ICD-10-CM

## 2012-01-23 DIAGNOSIS — M1710 Unilateral primary osteoarthritis, unspecified knee: Secondary | ICD-10-CM

## 2012-01-23 NOTE — Assessment & Plan Note (Addendum)
Supartz #3 of 5 done today. I placed some triamcinolone into her knee today as well. Return to clinic for #4 of 5 in one week, can cancel appointment for Supartz 4 and 5 if no improvement after #3 today.  She will use tramadol as an adjunct. She declines acetaminophen, NSAIDs, capsaicin. I did give her a bottle of topical spray.

## 2012-01-23 NOTE — Progress Notes (Signed)
SPORTS MEDICINE CONSULTATION REPORT  Subjective:    CC: Knee pain  HPI:  Right knee pain: Joanne Shaw is very pleasant, she comes back to further discuss her right knee pain. She has known degenerative joint disease, and has done moderately well with 2 intra-articular steroid injections, unfortunately they have only lasted about a month each. Her pain is in both joint lines, does not radiate, and is not associated with mechanical symptoms. She is here for Supartz injection #3.  Overall she hasn't really had much relief.  Past medical history, Surgical history, Family history, Social history, Allergies, and medications have been entered into the medical record, reviewed, and no changes needed.   Review of Systems: No headache, visual changes, nausea, vomiting, diarrhea, constipation, dizziness, abdominal pain, skin rash, fevers, chills, night sweats, weight loss, swollen lymph nodes, body aches, joint swelling, muscle aches, chest pain, or shortness of breath.   Objective:   Vitals:  Afebrile, vital signs stable. General: Well Developed, well nourished, and in no acute distress.   Right Knee: Normal to inspection with no erythema or effusion or obvious bony abnormalities. Palpation normal with no warmth, joint line tenderness, patellar tenderness, or condyle tenderness. ROM full in flexion and extension and lower leg rotation. Ligaments with solid consistent endpoints including ACL, PCL, LCL, MCL. Negative Mcmurray's, Apley's, and Thessalonian tests. Non painful patellar compression. Patellar glide without crepitus. Patellar and quadriceps tendons unremarkable. Hamstring and quadriceps strength is normal.   Procedure: Real-time Ultrasound Guided Injection of  right knee viscous supplementation. Device: GE Logiq E  Ultrasound guided injection is preferred based studies that show increased duration, increased effect, greater accuracy, decreased procedural pain, increased response rate, and  decreased cost with ultrasound guided versus blind injection.  Verbal informed consent obtained.  Time-out conducted.  Noted no overlying erythema, induration, or other signs of local infection.  Skin prepped in a sterile fashion.  Local anesthesia: Topical Ethyl chloride.  With sterile technique and under real time ultrasound guidance: 2 cc Kenalog 40, 4 cc lidocaine 1% injected into the, syringe switched and 25 mg/2.5 mL of Supartz (sodium hyaluronate) in a prefilled syringe was injected easily into the knee through a 22-gauge needle. Completed without difficulty  Pain immediately resolved suggesting accurate placement of the medication.  Advised to call if fevers/chills, erythema, induration, drainage, or persistent bleeding.  Images permanently stored and available for review in the ultrasound unit.  Impression: Technically successful ultrasound guided injection.  I then strapped her knee with a knee sleeve.  Impression and Recommendations:   This case required medical decision making of moderate complexity.

## 2012-02-03 ENCOUNTER — Ambulatory Visit (INDEPENDENT_AMBULATORY_CARE_PROVIDER_SITE_OTHER): Payer: Medicare Other | Admitting: Sports Medicine

## 2012-02-03 ENCOUNTER — Encounter: Payer: Self-pay | Admitting: Sports Medicine

## 2012-02-03 VITALS — BP 138/68 | HR 75 | Wt 128.0 lb

## 2012-02-03 DIAGNOSIS — M1710 Unilateral primary osteoarthritis, unspecified knee: Secondary | ICD-10-CM

## 2012-02-03 DIAGNOSIS — IMO0002 Reserved for concepts with insufficient information to code with codable children: Secondary | ICD-10-CM

## 2012-02-03 DIAGNOSIS — E785 Hyperlipidemia, unspecified: Secondary | ICD-10-CM

## 2012-02-03 MED ORDER — SIMVASTATIN 80 MG PO TABS: 40.0000 mg | ORAL_TABLET | Freq: Every day | ORAL | Status: DC

## 2012-02-03 NOTE — Assessment & Plan Note (Signed)
Refilled Zocor. She will further follow this up with her primary care physician

## 2012-02-03 NOTE — Progress Notes (Signed)
Procedure: Real-time Ultrasound Guided Injection of right knee Device: GE Logiq E  Ultrasound guided injection is preferred based studies that show increased duration, increased effect, greater accuracy, decreased procedural pain, increased response rate, and decreased cost with ultrasound guided versus blind injection.  Verbal informed consent obtained.  Time-out conducted.  Noted no overlying erythema, induration, or other signs of local infection.  Skin prepped in a sterile fashion.  Local anesthesia: Topical Ethyl chloride.  With sterile technique and under real time ultrasound guidance:  25 mg/2.5 mL of Supartz (sodium hyaluronate) in a prefilled syringe was injected easily into the knee through a 22-gauge needle. Completed without difficulty  Pain immediately resolved suggesting accurate placement of the medication.  Advised to call if fevers/chills, erythema, induration, drainage, or persistent bleeding.  Images permanently stored and available for review in the ultrasound unit.  Impression: Technically successful ultrasound guided injection. 

## 2012-02-03 NOTE — Assessment & Plan Note (Signed)
Supartz injection #45 and right knee. Return in one week for #5. Continue tramadol as needed.

## 2012-02-10 ENCOUNTER — Encounter: Payer: Self-pay | Admitting: Sports Medicine

## 2012-02-10 ENCOUNTER — Ambulatory Visit (INDEPENDENT_AMBULATORY_CARE_PROVIDER_SITE_OTHER): Payer: Medicare Other | Admitting: Sports Medicine

## 2012-02-10 VITALS — BP 148/65 | HR 76 | Wt 132.0 lb

## 2012-02-10 DIAGNOSIS — IMO0002 Reserved for concepts with insufficient information to code with codable children: Secondary | ICD-10-CM

## 2012-02-10 DIAGNOSIS — M1710 Unilateral primary osteoarthritis, unspecified knee: Secondary | ICD-10-CM

## 2012-02-10 NOTE — Progress Notes (Signed)
Procedure: Real-time Ultrasound Guided Injection of right knee Device: GE Logiq E  Ultrasound guided injection is preferred based studies that show increased duration, increased effect, greater accuracy, decreased procedural pain, increased response rate, and decreased cost with ultrasound guided versus blind injection.  Verbal informed consent obtained.  Time-out conducted.  Noted no overlying erythema, induration, or other signs of local infection.  Skin prepped in a sterile fashion.  Local anesthesia: Topical Ethyl chloride.  With sterile technique and under real time ultrasound guidance:  25 mg/2.5 mL of Supartz (sodium hyaluronate) in a prefilled syringe was injected easily into the knee through a 22-gauge needle. Completed without difficulty  Pain immediately resolved suggesting accurate placement of the medication.  Advised to call if fevers/chills, erythema, induration, drainage, or persistent bleeding.  Images permanently stored and available for review in the ultrasound unit.  Impression: Technically successful ultrasound guided injection. 

## 2012-02-10 NOTE — Assessment & Plan Note (Signed)
Supartz injection #5 given today. She will return in one month. Continue to use tramadol, and Salonpas patches

## 2012-03-09 ENCOUNTER — Ambulatory Visit: Payer: Medicare Other | Admitting: Sports Medicine

## 2012-03-16 ENCOUNTER — Other Ambulatory Visit: Payer: Self-pay | Admitting: *Deleted

## 2012-03-16 DIAGNOSIS — I10 Essential (primary) hypertension: Secondary | ICD-10-CM

## 2012-03-16 DIAGNOSIS — E039 Hypothyroidism, unspecified: Secondary | ICD-10-CM

## 2012-03-16 MED ORDER — LISINOPRIL-HYDROCHLOROTHIAZIDE 20-12.5 MG PO TABS: 1.0000 | ORAL_TABLET | Freq: Every day | ORAL | Status: DC

## 2012-03-16 MED ORDER — LEVOTHYROXINE SODIUM 75 MCG PO TABS: 75.0000 ug | ORAL_TABLET | Freq: Every day | ORAL | Status: DC

## 2012-03-23 ENCOUNTER — Other Ambulatory Visit: Payer: Self-pay | Admitting: *Deleted

## 2012-03-23 DIAGNOSIS — I10 Essential (primary) hypertension: Secondary | ICD-10-CM

## 2012-03-23 MED ORDER — METOPROLOL SUCCINATE ER 50 MG PO TB24: 50.0000 mg | ORAL_TABLET | Freq: Every day | ORAL | Status: DC

## 2012-04-27 ENCOUNTER — Other Ambulatory Visit: Payer: Self-pay | Admitting: Family Medicine

## 2012-04-27 ENCOUNTER — Other Ambulatory Visit: Payer: Self-pay | Admitting: *Deleted

## 2012-05-16 ENCOUNTER — Other Ambulatory Visit: Payer: Self-pay | Admitting: Family Medicine

## 2012-06-22 ENCOUNTER — Other Ambulatory Visit: Payer: Self-pay | Admitting: Family Medicine

## 2012-07-06 ENCOUNTER — Telehealth: Payer: Self-pay | Admitting: *Deleted

## 2012-07-06 NOTE — Telephone Encounter (Signed)
Pt called stating you told her that you would call her some pain patches for her knees if her pain med didn't work. She is asking for them to be called in. Please advise.

## 2012-07-07 MED ORDER — DICLOFENAC EPOLAMINE 1.3 % TD PTCH: 1.0000 | MEDICATED_PATCH | Freq: Two times a day (BID) | TRANSDERMAL | Status: DC

## 2012-07-07 NOTE — Telephone Encounter (Signed)
Pt informed

## 2012-07-07 NOTE — Telephone Encounter (Signed)
Adding flector patches, if she is hurting again after the supartz series we also should discuss this.

## 2012-07-20 ENCOUNTER — Other Ambulatory Visit: Payer: Self-pay | Admitting: Sports Medicine

## 2012-07-20 NOTE — Telephone Encounter (Signed)
Must make appointment 

## 2012-07-24 ENCOUNTER — Other Ambulatory Visit: Payer: Self-pay | Admitting: Family Medicine

## 2012-08-21 ENCOUNTER — Other Ambulatory Visit: Payer: Self-pay | Admitting: Family Medicine

## 2012-08-24 ENCOUNTER — Other Ambulatory Visit: Payer: Self-pay | Admitting: Family Medicine

## 2013-02-17 ENCOUNTER — Other Ambulatory Visit: Payer: Self-pay | Admitting: Sports Medicine

## 2014-03-21 IMAGING — CR DG HAND COMPLETE 3+V*L*
3 series · 3 of 3 positions shown · non-contrast
Comparison: None.

CLINICAL DATA: Pain and bruising over the fifth metacarpal

LEFT HAND - COMPLETE 3+ VIEW

[view not recorded (1 of 3)]
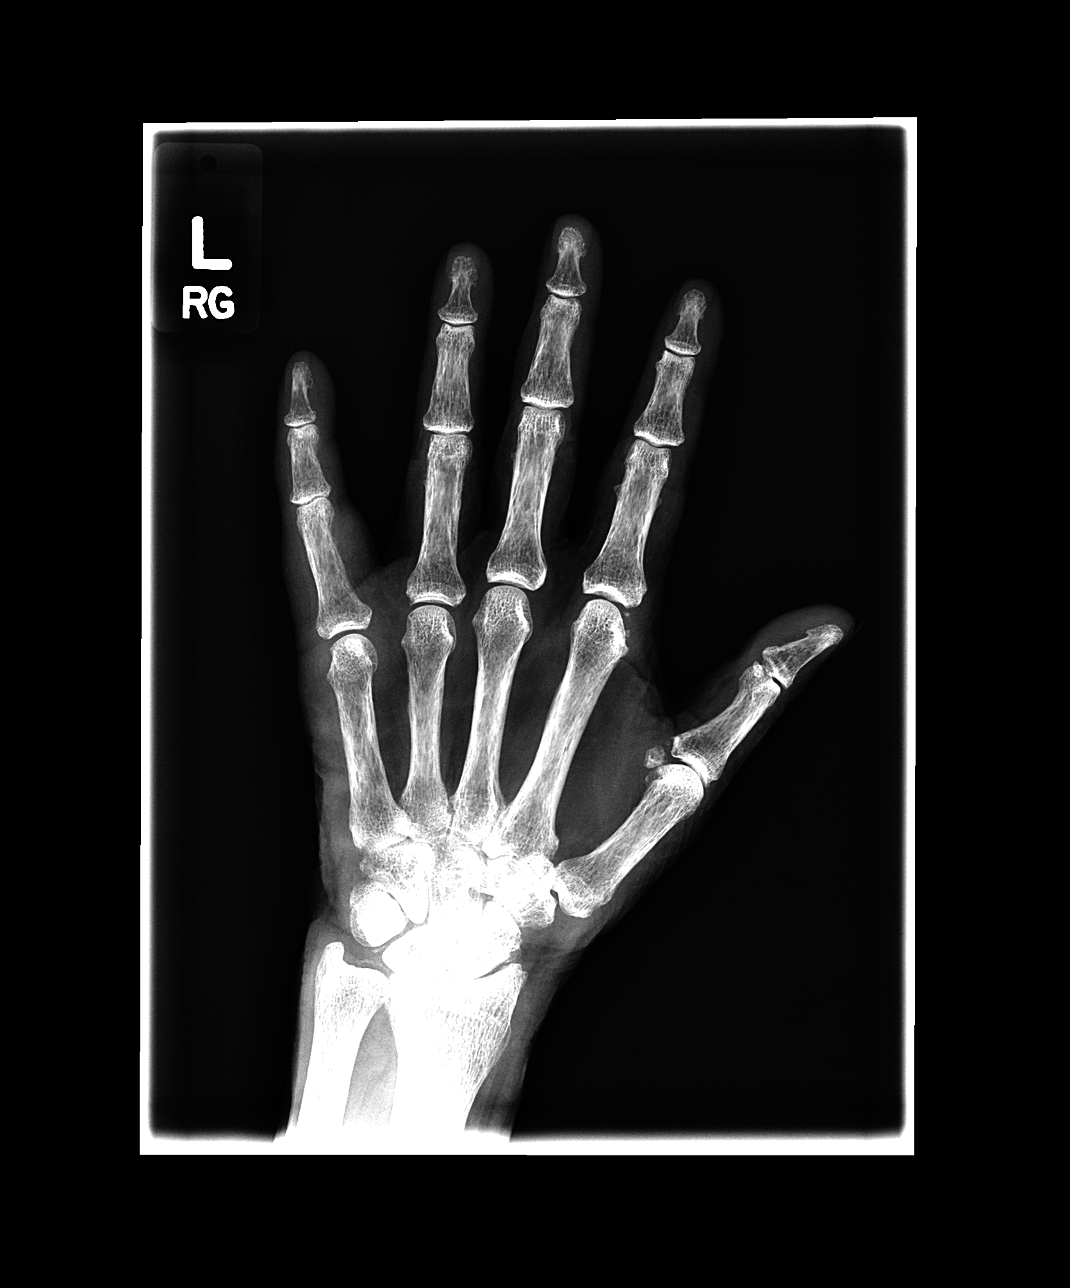

[view not recorded (2 of 3)]
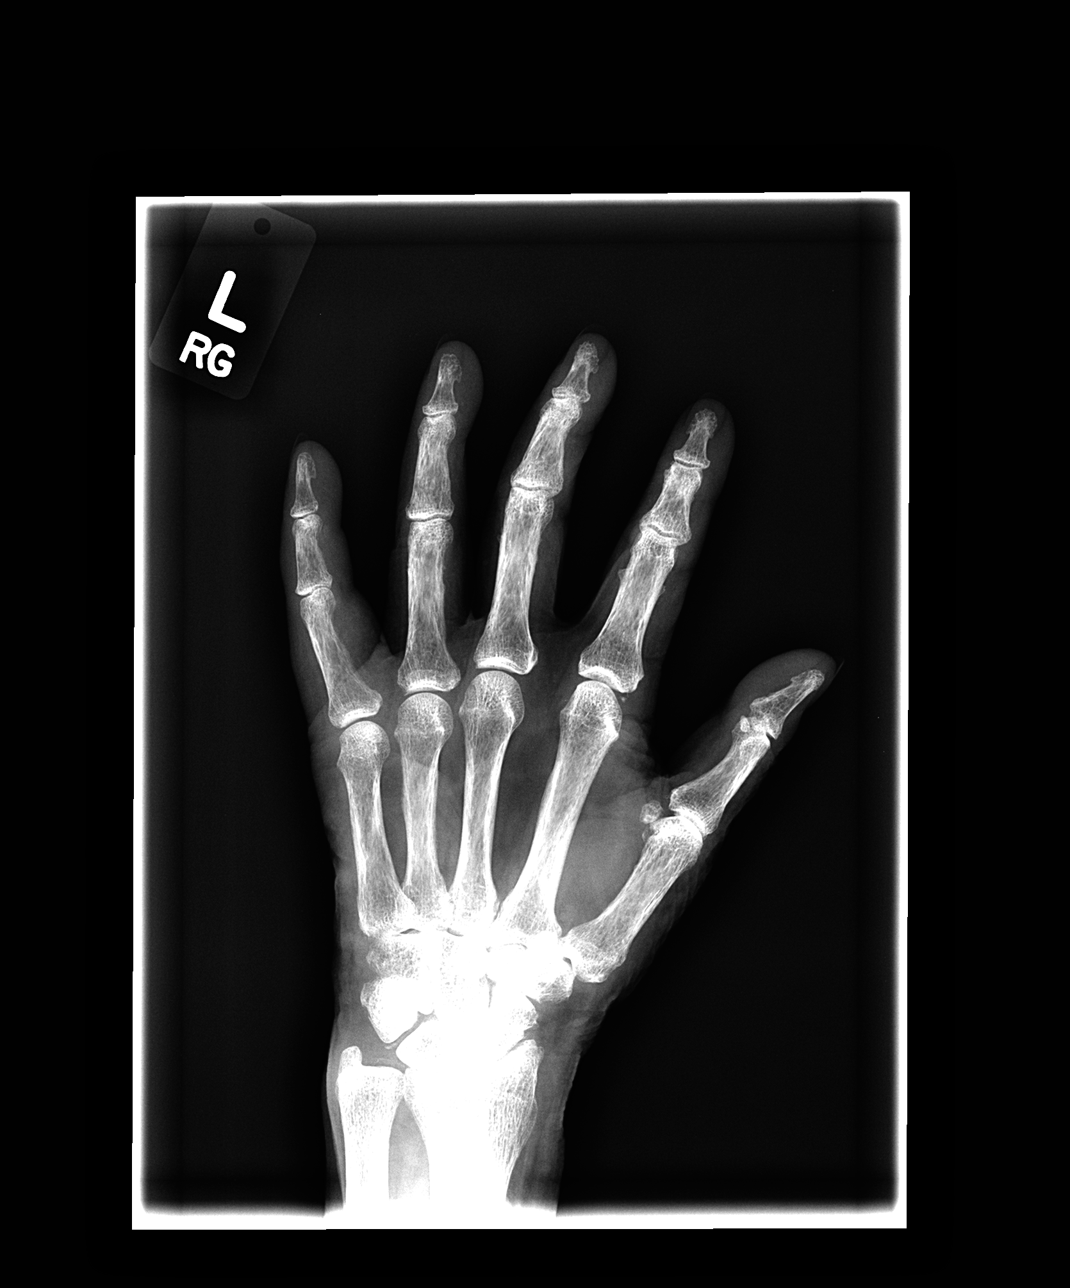

[view not recorded (3 of 3)]
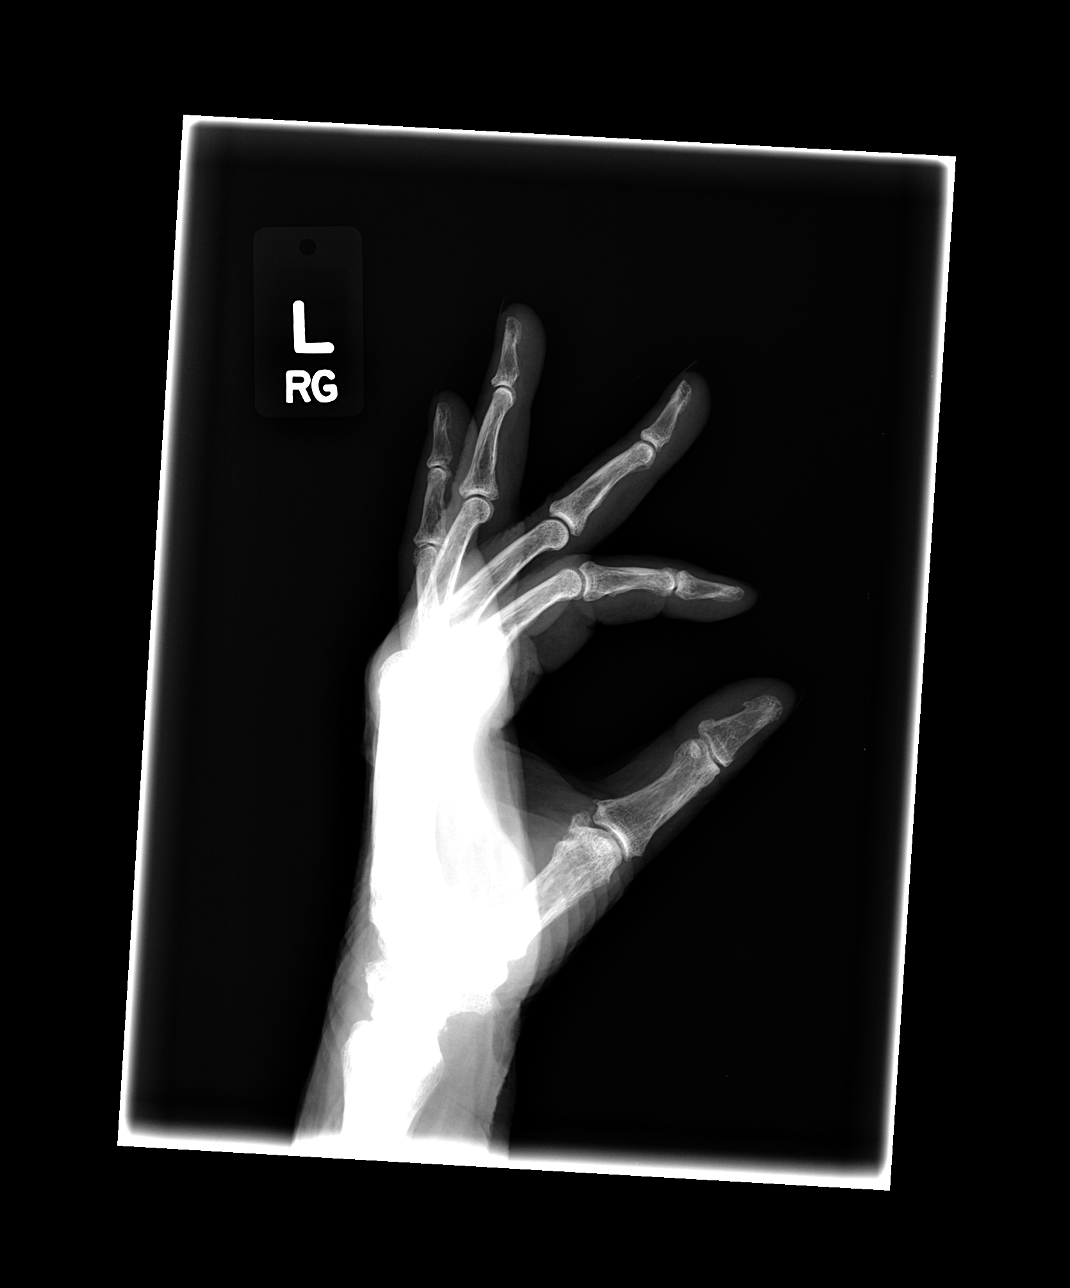

[3 of 3 positions shown; findings below may reference images not displayed]

FINDINGS: The radiocarpal joint space is within normal limits.
There does appear to be faint chondrocalcinosis of the triangular
fibrocartilage which may indicate osteoarthritis or CPPD.  The
bones are diffusely osteopenic.  No acute fracture is seen.  Very
minimal degenerative change is noted.
IMPRESSION: 1.  Diffuse osteopenia.  Minimal degenerative change.
2.  No fracture.
3.  Chondrocalcinosis of the wrist.  Question OA or CPPD.

## 2022-05-26 DIAGNOSIS — R296 Repeated falls: Secondary | ICD-10-CM | POA: Insufficient documentation

## 2022-05-26 DIAGNOSIS — G309 Alzheimer's disease, unspecified: Secondary | ICD-10-CM | POA: Diagnosis present

## 2022-05-26 DIAGNOSIS — B338 Other specified viral diseases: Secondary | ICD-10-CM | POA: Insufficient documentation

## 2022-06-18 ENCOUNTER — Non-Acute Institutional Stay: Payer: Medicare Other | Admitting: Family Medicine

## 2022-06-18 ENCOUNTER — Encounter: Payer: Self-pay | Admitting: Family Medicine

## 2022-06-18 VITALS — BP 160/76 | HR 72 | Temp 98.2°F | Resp 16

## 2022-06-18 DIAGNOSIS — E876 Hypokalemia: Secondary | ICD-10-CM

## 2022-06-18 DIAGNOSIS — D500 Iron deficiency anemia secondary to blood loss (chronic): Secondary | ICD-10-CM

## 2022-06-18 DIAGNOSIS — N1832 Chronic kidney disease, stage 3b: Secondary | ICD-10-CM

## 2022-06-18 DIAGNOSIS — E43 Unspecified severe protein-calorie malnutrition: Secondary | ICD-10-CM

## 2022-06-18 NOTE — Progress Notes (Signed)
Therapist, nutritional Palliative Care Consult Note Telephone: [phone]  Fax: [phone]   Date of encounter: 06/18/22 2:51 PM PATIENT NAME: Joanne Shaw [address] [place] [address]   [phone] (home)  DOB: [birth year] MRN: [id number] PRIMARY CARE PROVIDER:    No primary care provider on file.,  No primary provider on file. None  REFERRING PROVIDER:   No referring provider defined for this encounter. N/A  Emergency Contact:    Contact Information     Name Relation Home Work [place] Other  [phone]        Health Care POA/Health Care Agent Son, Kendyl Gorr  I met face to face with patient in [facility] facility. Palliative Care was asked to follow this patient by consultation request of No ref. provider found to address advance care planning and complex medical decision making. This is an initial visit.    CODE STATUS: Full code at present  ASSESSMENT AND / RECOMMENDATIONS:  PPS: 40%  Anemia due to blood loss     Follow up Palliative Care Visit:  Palliative Care continuing to follow up by monitoring for changes in appetite, weight, functional and cognitive status for chronic disease progression and management in agreement with patient's stated goals of care. Next visit in 4 weeks or prn.  This visit was coded based on medical decision making (MDM).  Chief Complaint  Palliative Care received a referral to follow up with pt who has a hx of dementia and recent admission for acute hypoxic respiratory failure due to RSV and to follow up and assist family with goals of care.  HISTORY OF PRESENT ILLNESS:  Joanne Shaw is a 89 y.o. year old female with hx of Alzheimer's Dementia with anxiety and hx of breast cancer s/p right mastectomy.  She had a recent admission on 05/25/22 for acute hypoxic respiratory failure due to RSV infection with microcytic anemia and fall OOB hitting the back of her head.  She  was using a walker at home prior to admission. Acute on chronic anemia with HGB 5.9, hemoccult stool negative.  At one point she had been on Eliquis which had not been given at her nursing home and she was transfused 1 unit PRBC with d/c stable HGB 7.5, iron low at 20, TIBC low at 189.  LDH and Haptoglobin were normal.  GI recommended conservative/expectant management.  She also received 1 dose of IV iron and continued on home iron supplement. UA was abnormal and pt was treated with IV Ceftriaxone for possible UTI and sent to facility on oral Cefdinir to complete course.  Facility staff indicates that she is getting Ensure Plus BID nutritional supplement and thrive milkshakes.  She was noted during hospitalization to be in atrial fibrillation with RVR with vent rate 111. Pt was set up to receive [facility] PT/OT resumption of services with [facility]. Was exhibiting deficits in short term memory. Pt states she is using a wc for mobility rather than a walker.  She states she can take a shower/dress on her own but "they won't let me but sometimes I do it anyway."  Thinks today is Sunday, doesn't know time of day or year.  Denies sticky quality to stools, bright red blood in stools.  Denies falls.   States when she went to the hospital she slid out the side of the bed and "bumped my head" but denies fall.  Denies significant pain, CP, SOB or congestion.  States she has no  appetite but has not had any since she became ill with RSV.  Denies constipation, dysuria. Says she does have RA which is why she takes Methotrexate.    ACTIVITIES OF DAILY LIVING: CONTINENT OF BLADDER? Y/N CONTINENT OF BOWEL? Y/N BATHING/DRESSING/FEEDING:  MOBILITY:   INDEPENDENTLY AMBULATORY/AMBULATORY WITH ASSISTIVE DEVICE: Administrator WHEELCHAIR/POWER CHAIR? BEDBOUND?  APPETITE? WEIGHT: 99.8 lbs as of yesterday, weight 106 lbs on 05/25/22  CURRENT PROBLEM LIST:  Patient Active Problem List   Diagnosis Date Noted   POSTMENOPAUSAL  STATUS 03/06/2010   NEOPLASM, MALIGNANT, RIGHT BREAST 01/16/2010   MAMMOGRAM, ABNORMAL, RIGHT 11/20/2009   OSTEOARTHRITIS, KNEE, RIGHT 07/13/2009   HYPOTHYROIDISM 05/30/2009   HYPERLIPIDEMIA 05/30/2009   HYPERTENSION 05/30/2009   PAST MEDICAL HISTORY:  Active Ambulatory Problems    Diagnosis Date Noted   NEOPLASM, MALIGNANT, RIGHT BREAST 01/16/2010   HYPOTHYROIDISM 05/30/2009   HYPERLIPIDEMIA 05/30/2009   HYPERTENSION 05/30/2009   OSTEOARTHRITIS, KNEE, RIGHT 07/13/2009   MAMMOGRAM, ABNORMAL, RIGHT 11/20/2009   POSTMENOPAUSAL STATUS 03/06/2010   Resolved Ambulatory Problems    Diagnosis Date Noted   UNSPECIFIED INFECTIVE OTITIS EXTERNA 05/30/2009   Headache(784.0) 05/30/2009   UTI (lower urinary tract infection) 07/04/2010   Synovitis of left fifth metacarpophalangeal joint. 11/20/2011   Past Medical History:  Diagnosis Date   Cancer (HCC) 11-11   Hyperlipidemia    Hypertension    Hypothyroidism    Occipital neuralgia    Osteopenia    Right knee DJD    SOCIAL HX:  Social History   Tobacco Use   Smoking status: Former    Types: Cigarettes    Quit date: 03/04/1998    Years since quitting: 24.3   Smokeless tobacco: Not on file  Substance Use Topics   Alcohol use: Yes    Alcohol/week: 7.0 standard drinks of alcohol    Types: 7 Glasses of wine per week    Comment: with dinner   FAMILY HX: No family history on file.     Preferred Pharmacy: ALLERGIES:  Allergies  Allergen Reactions   Codeine Sulfate     REACTION: N/V   Demerol [Meperidine] Nausea Only   Penicillins     REACTION: swelling     PERTINENT MEDICATIONS:  Outpatient Encounter Medications as of 06/18/2022  Medication Sig   Acetaminophen 500 MG PACK Take 500 mg by mouth every 6 (six) hours as needed (pain). Oral liquid is 500 mg/15 ml   amLODipine (NORVASC) 5 MG tablet Take 5 mg by mouth daily.   ascorbic acid (VITAMIN C) 250 MG tablet Take 250 mg by mouth as directed. One daily Tues, Thurs and Sat  for iron absorption   aspirin 81 MG tablet Take 81 mg by mouth daily.     atorvastatin (LIPITOR) 40 MG tablet Take 40 mg by mouth at bedtime.   DM-Phenylephrine-Acetaminophen (GNP COLD RELIEF MULTI-SYMPTOM) 10-5-325 MG TABS Take 2 tablets by mouth every 6 (six) hours as needed (cold symptoms).   ferrous sulfate 325 (65 FE) MG tablet Take 325 mg by mouth as directed. Daily with breakfast on Tues, Thurs, Sat   fluticasone (FLONASE) 50 MCG/ACT nasal spray Place 1 spray into both nostrils daily.   hydrALAZINE (APRESOLINE) 10 MG tablet Take 10 mg by mouth every 8 (eight) hours as needed (for SBP > 180).   levothyroxine (SYNTHROID) 88 MCG tablet Take 88 mcg by mouth daily before breakfast.   loratadine (CLARITIN) 10 MG tablet Take 10 mg by mouth daily.   Menthol-Methyl Salicylate (SALONPAS PAIN RELIEF PATCH) 3-10 %  PTCH Apply 1 patch topically every 8 (eight) hours as needed (pain, remove after 8 hours).   methotrexate (RHEUMATREX) 2.5 MG tablet Take 10 mg by mouth once a week. Caution:Chemotherapy. Protect from light. On Fridays   metoprolol succinate (TOPROL-XL) 50 MG 24 hr tablet TAKE 1 TABLET BY MOUTH EVERY DAY   mirtazapine (REMERON) 7.5 MG tablet Take 7.5 mg by mouth at bedtime. Appetite stimulation   Multiple Vitamins-Minerals (PRESERVISION/LUTEIN PO) Take 1 tablet by mouth daily.   Nutritional Supplements (ENSURE ORIGINAL) LIQD Take 1 Can by mouth 2 (two) times daily. Protein calorie malnutrition   ondansetron (ZOFRAN) 4 MG tablet Take 4 mg by mouth every 6 (six) hours as needed for nausea or vomiting.   pantoprazole (PROTONIX) 40 MG tablet Take 40 mg by mouth daily.   polyethylene glycol (MIRALAX / GLYCOLAX) 17 g packet Take 17 g by mouth daily.   sertraline (ZOLOFT) 50 MG tablet Take 50 mg by mouth daily.   [DISCONTINUED] diclofenac (FLECTOR) 1.3 % PTCH Place 1 patch onto the skin 2 (two) times daily. Dispense one box.   [DISCONTINUED] levothyroxine (SYNTHROID, LEVOTHROID) 75 MCG tablet TAKE  1 TABLET BY MOUTH EVERY DAY   [DISCONTINUED] levothyroxine (SYNTHROID, LEVOTHROID) 75 MCG tablet TAKE 1 TABLET BY MOUTH EVERY DAY   [DISCONTINUED] lisinopril-hydrochlorothiazide (PRINZIDE,ZESTORETIC) 20-12.5 MG per tablet TAKE 1 TABLET BY MOUTH DAILY.   [DISCONTINUED] lisinopril-hydrochlorothiazide (PRINZIDE,ZESTORETIC) 20-12.5 MG per tablet TAKE 1 TABLET BY MOUTH DAILY.   [DISCONTINUED] meloxicam (MOBIC) 15 MG tablet One tab PO qAM with breakfast for 2 weeks, then daily prn pain.   [DISCONTINUED] Multiple Vitamins-Minerals (MULTIVITAMIN PO) Take by mouth.   [DISCONTINUED] simvastatin (ZOCOR) 80 MG tablet TAKE 0.5 TABLETS (40 MG TOTAL) BY MOUTH AT BEDTIME.   [DISCONTINUED] tamoxifen (NOLVADEX) 20 MG tablet    [DISCONTINUED] traMADol (ULTRAM) 50 MG tablet    No facility-administered encounter medications on file as of 06/18/2022.    History obtained from review of EMR, discussion with facility staff/caregiver and/or patient.   BMP: Component 05/29/22 05/28/22 05/27/22 05/26/22 05/25/22  Sodium 139 137 139 138 137  Potassium 3.0 Low  3.5  4.1  3.3 Low  3.4  Chloride 107 106 108 High  105 103  CO2 22 21 24 24 25   Anion Gap 10 10 7 9 9   Glucose, Random 84 104 High  105 High  89 109 High   Blood Urea Nitrogen (BUN) 27 High  34 High  34 High  32 High  27 High   Creatinine 1.50 High  1.69 High  1.76 High  1.76 High  1.80 High   eGFR 33 Low   29 Low   27 Low   27 Low   27 Low    Calcium 8.6 8.4 Low  8.4 Low  7.9 Low  8.1 Low   BUN/Creatinine Ratio 18.0 20.1 High   19.3     Component 05/29/22 05/28/22 05/27/22 05/26/22  Magnesium 1.7 Low  1.9 1.9 1.3 Low    CBC: Component 05/29/22 05/28/22 05/28/22 05/27/22 05/27/22 05/26/22  WBC 2.62 Low  4.37 Low  5.72 -- 4.44 --  RBC 2.44 Low  2.31 Low  2.29 Low  -- 2.46 Low  --  Hemoglobin 7.5 Low  7.3 Low  7.2 Low  7.7 Low  7.8 Low  8.3 Low   Hematocrit 22.7 Low  21.5 Low  21.1 Low  22.7 Low  22.4 Low  24.6 Low   Mean Corpuscular Volume (MCV) 92.9  93.1 92.2 -- 91.3 --  Mean Corpuscular Hemoglobin (MCH) 30.8 31.5 31.5 -- 31.9 --  Mean Corpuscular Hemoglobin Conc (MCHC) 33.2 33.9 34.2 -- 35.0 --  Red Cell Distribution Width (RDW) 18.6 High  18.2 High  18.6 High  -- 19.0 High  --  Platelet Count (PLT) 293 296 267  -- 169 --  Mean Platelet Volume (MPV) 8.0 8.0 8.9 -- 8.7 --    CMP: Component 03/29/22 03/28/22 03/27/22 03/26/22 03/25/22 03/24/22  HX SODIUM 137 139 138 140 138 138  HX POTASSIUM 3.9 3.4 Low  3.7 3.4 Low  3.5 3.9  HX CHLORIDE 105 107 107 106 106 105  HX CO2 25 26 23 24 24 25   HX BUN 19 21 23 22 25  High  26 High   HX GLUCOSE 98  99  88  89  101 High   134 High    HX CREATININE 1.54 High  1.60 High  1.76 High  1.95 High  2.14 High  2.21 High   HX CALCIUM 8.7 8.6 8.1 Low  8.7 8.7 8.7  HX TOTAL PROTEIN 5.6 Low   5.5 Low   5.0 Low   -- -- --  HX ALBUMIN 3.3 Low  3.2 Low  2.9 Low  -- -- --  HX BILIRUBIN TOTAL 0.8  0.7  0.6  -- -- --  HX ALKALINE PHOSPHATASE 71 73 67 -- -- --  HX AST (SGOT) 39 42 High  45 High  -- -- --  HX ALT (SGPT) 26 30 30  -- -- --  HX ANION GAP (WC) 7 6 8 10 8 8   HX EST. GFR 32 Low   31 Low   27 Low   24 Low   22 Low   21 Low     Component Ref Range & Units 2 mo ago  Free Kappa 3.30 - 19.40 mg/L 48.30 High   FREE LAMBDA 5.71 - 26.30 mg/L 43.67 High   KAPPA/LAMBDA RATIO 0.26 - 1.65 1.11  Resulting Agency Reading [facility]  Specimen Collected: 03/24/22 12:01   Performed by: [place] [facility] Last Resulted: 03/25/22 16:16  Received From: [facility] visits prior to 05/04/2022.    Component Ref Range & Units 2 mo ago  PATHOLOGIST CHARGE ID [id number]  Total Protein 6.4 - 8.9 G/DL 5.1 Low   [id number]-6-[id number] 0.1 - 0.4 G/DL 0.4  [id number]-8-GNFAOZHY 0.4 - 1.2 G/DL 0.5  Beta-1- Globulin 0.4 - 0.7 G/DL 0.3 Low   Beta-2- Globulin 0.1 - 0.4 G/DL 0.3  Gamma Globulin 0.5 - 1.6 G/DL 0.6  Comment SPE NO M SPIKE SEEN.  Reviewed and Interpreted By  Lissa Merlin M.D.  Albumin 3.5 - 5.0 G/DL 3.0 Low    Specimen: Blood Component Ref Range & Units 2 yr ago  RA Quant 0.0 - 13.9 IU/mL 22.4 High   Resulting Agency [facility]  Narrative Performed by [facility] Performed at:  [address] [place] [address] [place], [place]  [id number] Lab Director: Jolene Schimke MD, Phone:  [phone] Specimen Collected: 01/20/20 08:54   Performed by: [facility] Last Resulted: 01/21/20 05:36  Received From: [facility]  Result Received: 06/14/22 13:41   Urinalysis:  Component 05/25/22 03/19/22 09/19/20  Color, Urine Yellow -- --  Clarity, Urine Cloudy Abnormal  -- --  Specific Gravity, Urine 1.009 -- --  pH, Urine 6.0 -- --  Protein, Urine 300 Abnormal  -- --  Glucose, Urine Negative -- --  Ketones, Urine Negative -- --  Bilirubin, Urine Negative Negative Negative  Blood, Urine Negative 2+ Abnormal  Negative  Nitrite, Urine Negative -- --  Leukocyte Esterase, Urine 250 Abnormal  Negative Negative  Urobilinogen, Urine Normal -- --  WBC, Urine 21-30 Abnormal  -- --  RBC, Urine 3-5 Abnormal  -- --  Bacteria, Urine Many Abnormal  -- --  Squamous Epithelial Cells, Urine 0-5 -- --     05/25/22 CXR 1 view: No acute cardiopulmonary process  05/25/22  CT head without contrast: INDINGS: Brain: No evidence of acute infarction, hemorrhage, hydrocephalus, extra-axial collection or mass lesion/mass effect. There is moderate cerebral volume loss with associated ex vacuo dilatation. Periventricular white matter hypoattenuation likely represents chronic small vessel ischemic disease. CSF collection along the left paramedian frontal lobe appears unchanged. Vascular: There are vascular calcifications in the carotid siphons. Skull: Normal. Negative for fracture or focal lesion. Sinuses/Orbits: There is sinus disease in the bilateral ethmoid, bilateral maxillary, and right sphenoid sinuses. Other: None.   1. No acute intracranial process.  2.  Paranasal sinus disease.   I reviewed available labs, medications, imaging, studies and related documents from the EMR.  Records reviewed and summarized above.   Physical Exam: GENERAL: NAD LUNGS: CTAB, no increased work of breathing, room air CARDIAC:  S1S2, RRR with no MRG, No edema/cyanosis ABD:  Normo-active BS x 4 quads, soft, non-tender EXTREMITIES: Normal ROM, no deformity, strength equal, No muscle atrophy/subcutaneous fat loss NEURO:  Noted mild cognitive impairment with short term memory deficits  PSYCH:  non-anxious affect, A & O x 1-2  Thank you for the opportunity to participate in the care of Carrisa Roa. Please call our main office at [phone] if we can be of additional assistance.    Joycelyn Man FNP-C  [email or web address] .[facility]  Phone:  [phone]

## 2022-06-30 DIAGNOSIS — E43 Unspecified severe protein-calorie malnutrition: Secondary | ICD-10-CM | POA: Insufficient documentation

## 2022-06-30 DIAGNOSIS — D500 Iron deficiency anemia secondary to blood loss (chronic): Secondary | ICD-10-CM | POA: Insufficient documentation

## 2022-06-30 DIAGNOSIS — N1832 Chronic kidney disease, stage 3b: Secondary | ICD-10-CM | POA: Insufficient documentation

## 2022-06-30 DIAGNOSIS — E876 Hypokalemia: Secondary | ICD-10-CM | POA: Insufficient documentation

## 2022-06-30 NOTE — Progress Notes (Signed)
Therapist, nutritional Palliative Care Consult Note Telephone: [phone]  Fax: [phone]   Date of encounter: 06/30/22 11:08 PM PATIENT NAME: Joanne Shaw [address] Joanne [place] [address]   [phone] (home)  DOB: [birth year] MRN: [id number] PRIMARY CARE PROVIDER:    No primary care provider on file.,  No primary provider on file. None  REFERRING PROVIDER:   No referring provider defined for this encounter. N/A  Emergency Contact:    Contact Information     Name Relation Home Work [place] Other  [phone]        Health Care POA/Health Care Agent Son, Joanne Shaw  I met face to face with patient in [facility] facility. Palliative Care was asked to follow this patient by consultation request of No ref. provider found to address advance care planning and complex medical decision making. This is an initial visit.    CODE STATUS: Full code at present  ASSESSMENT AND / RECOMMENDATIONS:  PPS: 40%  Anemia due to blood loss Likely combination of CKD and blood loss. Stabilized at 7.5. Continue Ferrous sulfate 325 mg daily on Tues, Thurs and Sat with Vitamin C 250 mg. Monitor for blood loss   Severe protein calorie malnutrition Weight loss of 7 lbs since 05/25/22 Last Albumin 3.3 improved from 2.9 Continue nutritional supplements   Stage 3b CKD Likely due to dehydration with diuretic therapy and ACE-I. Continue to encourage fluid intake Avoid nephrotoxic substances  4.    Hypokalemia/hypomagnesemia Last Potassium was 3.0 with magnesium 1.7 Encourage addition of Mag Ox 400 mg BID to help maintain potassium.   Follow up Palliative Care Visit:  Palliative Care continuing to follow up by monitoring for changes in appetite, weight, functional and cognitive status for chronic disease progression and management in agreement with patient's stated goals of care. Next visit in 4 weeks or prn.  This visit was coded  based on medical decision making (MDM).  Chief Complaint  Palliative Care received a referral to follow up with pt who has a hx of dementia and recent admission for acute hypoxic respiratory failure due to RSV and to follow up and assist family with goals of care.  HISTORY OF PRESENT ILLNESS:  Joanne Shaw is a 89 y.o. year old female with hx of Alzheimer's Dementia with anxiety and hx of breast cancer s/p right mastectomy.  She had a recent admission on 05/25/22 for acute hypoxic respiratory failure due to RSV infection with microcytic anemia and fall OOB hitting the back of her head.  She was using a walker at home prior to admission. Acute on chronic anemia with HGB 5.9, hemoccult stool negative.  At one point she had been on Eliquis which had not been given at her nursing home and she was transfused 1 unit PRBC with d/c stable HGB 7.5, iron low at 20, TIBC low at 189.  LDH and Haptoglobin were normal.  GI recommended conservative/expectant management.  She also received 1 dose of IV iron and continued on home iron supplement. UA was abnormal and pt was treated with IV Ceftriaxone for possible UTI and sent to facility on oral Cefdinir to complete course.  Facility staff indicates that she is getting Ensure Plus BID nutritional supplement and thrive milkshakes.  She was noted during hospitalization to be in atrial fibrillation with RVR with vent rate 111. Pt was set up to receive [facility] PT/OT resumption of services with [facility]. Was exhibiting deficits in short term memory. Pt  states she is using a wc for mobility rather than a walker.  She states she can take a shower/dress on her own but "they won't let me but sometimes I do it anyway."  Thinks today is Sunday, doesn't know time of day or year.  Denies sticky quality to stools, bright red blood in stools.  Denies falls.   States when she went to the hospital she slid out the side of the bed and "bumped my head" but denies fall.  Denies significant pain, CP,  SOB or congestion.  States she has no appetite but has not had any since she became ill with RSV.  Denies constipation, dysuria. Says she does have RA which is why she takes Methotrexate.    ACTIVITIES OF DAILY LIVING: CONTINENT OF BLADDER/BOWEL? Yes BATHING/DRESSING/FEEDING: requires standby assistance for bathing/dressing, independent with feeding  MOBILITY:   INDEPENDENTLY AMBULATORY/AMBULATORY WITH ASSISTIVE DEVICE: Walker with short distances and assistance   APPETITE? WEIGHT: 99.8 lbs as of yesterday, weight 106 lbs on 05/25/22  CURRENT PROBLEM LIST:  Patient Active Problem List   Diagnosis Date Noted   Anemia due to blood loss 06/30/2022   Severe protein-calorie malnutrition (HCC) 06/30/2022   Stage 3b chronic kidney disease (HCC) 06/30/2022   POSTMENOPAUSAL STATUS 03/06/2010   NEOPLASM, MALIGNANT, RIGHT BREAST 01/16/2010   MAMMOGRAM, ABNORMAL, RIGHT 11/20/2009   OSTEOARTHRITIS, KNEE, RIGHT 07/13/2009   HYPOTHYROIDISM 05/30/2009   HYPERLIPIDEMIA 05/30/2009   HYPERTENSION 05/30/2009   PAST MEDICAL HISTORY:  Active Ambulatory Problems    Diagnosis Date Noted   NEOPLASM, MALIGNANT, RIGHT BREAST 01/16/2010   HYPOTHYROIDISM 05/30/2009   HYPERLIPIDEMIA 05/30/2009   HYPERTENSION 05/30/2009   OSTEOARTHRITIS, KNEE, RIGHT 07/13/2009   MAMMOGRAM, ABNORMAL, RIGHT 11/20/2009   POSTMENOPAUSAL STATUS 03/06/2010   Anemia due to blood loss 06/30/2022   Severe protein-calorie malnutrition (HCC) 06/30/2022   Stage 3b chronic kidney disease (HCC) 06/30/2022   Resolved Ambulatory Problems    Diagnosis Date Noted   UNSPECIFIED INFECTIVE OTITIS EXTERNA 05/30/2009   Headache(784.0) 05/30/2009   UTI (lower urinary tract infection) 07/04/2010   Synovitis of left fifth metacarpophalangeal joint. 11/20/2011   Past Medical History:  Diagnosis Date   Cancer (HCC) 11-11   Hyperlipidemia    Hypertension    Hypothyroidism    Occipital neuralgia    Osteopenia    Right knee DJD     SOCIAL HX:  Social History   Tobacco Use   Smoking status: Former    Types: Cigarettes    Quit date: 03/04/1998    Years since quitting: 24.3   Smokeless tobacco: Not on file  Substance Use Topics   Alcohol use: Yes    Alcohol/week: 7.0 standard drinks of alcohol    Types: 7 Glasses of wine per week    Comment: with dinner   FAMILY HX: No family history on file.     Preferred Pharmacy: ALLERGIES:  Allergies  Allergen Reactions   Amlodipine    Codeine Sulfate     REACTION: N/V   Demerol [Meperidine] Nausea Only   Lisinopril    Oxycodone    Penicillins     REACTION: swelling     PERTINENT MEDICATIONS:  Outpatient Encounter Medications as of 06/18/2022  Medication Sig   Acetaminophen 500 MG PACK Take 500 mg by mouth every 6 (six) hours as needed (pain). Oral liquid is 500 mg/15 ml   amLODipine (NORVASC) 5 MG tablet Take 5 mg by mouth daily.   ascorbic acid (VITAMIN C) 250 MG tablet Take 250  mg by mouth as directed. One daily Tues, Thurs and Sat for iron absorption   aspirin 81 MG tablet Take 81 mg by mouth daily.     atorvastatin (LIPITOR) 40 MG tablet Take 40 mg by mouth at bedtime.   DM-Phenylephrine-Acetaminophen (GNP COLD RELIEF MULTI-SYMPTOM) 10-5-325 MG TABS Take 2 tablets by mouth every 6 (six) hours as needed (cold symptoms).   ferrous sulfate 325 (65 FE) MG tablet Take 325 mg by mouth as directed. Daily with breakfast on Tues, Thurs, Sat   fluticasone (FLONASE) 50 MCG/ACT nasal spray Place 1 spray into both nostrils daily.   hydrALAZINE (APRESOLINE) 10 MG tablet Take 10 mg by mouth every 8 (eight) hours as needed (for SBP > 180).   levothyroxine (SYNTHROID) 88 MCG tablet Take 88 mcg by mouth daily before breakfast.   loratadine (CLARITIN) 10 MG tablet Take 10 mg by mouth daily.   Menthol-Methyl Salicylate (SALONPAS PAIN RELIEF PATCH) 3-10 % PTCH Apply 1 patch topically every 8 (eight) hours as needed (pain, remove after 8 hours).   methotrexate (RHEUMATREX) 2.5 MG  tablet Take 10 mg by mouth once a week. Caution:Chemotherapy. Protect from light. On Fridays   metoprolol succinate (TOPROL-XL) 50 MG 24 hr tablet TAKE 1 TABLET BY MOUTH EVERY DAY   mirtazapine (REMERON) 7.5 MG tablet Take 7.5 mg by mouth at bedtime. Appetite stimulation   Multiple Vitamins-Minerals (PRESERVISION/LUTEIN PO) Take 1 tablet by mouth daily.   Nutritional Supplements (ENSURE ORIGINAL) LIQD Take 1 Can by mouth 2 (two) times daily. Protein calorie malnutrition   ondansetron (ZOFRAN) 4 MG tablet Take 4 mg by mouth every 6 (six) hours as needed for nausea or vomiting.   pantoprazole (PROTONIX) 40 MG tablet Take 40 mg by mouth daily.   polyethylene glycol (MIRALAX / GLYCOLAX) 17 g packet Take 17 g by mouth daily.   sertraline (ZOLOFT) 50 MG tablet Take 50 mg by mouth daily.   [DISCONTINUED] diclofenac (FLECTOR) 1.3 % PTCH Place 1 patch onto the skin 2 (two) times daily. Dispense one box.   [DISCONTINUED] levothyroxine (SYNTHROID, LEVOTHROID) 75 MCG tablet TAKE 1 TABLET BY MOUTH EVERY DAY   [DISCONTINUED] levothyroxine (SYNTHROID, LEVOTHROID) 75 MCG tablet TAKE 1 TABLET BY MOUTH EVERY DAY   [DISCONTINUED] lisinopril-hydrochlorothiazide (PRINZIDE,ZESTORETIC) 20-12.5 MG per tablet TAKE 1 TABLET BY MOUTH DAILY.   [DISCONTINUED] lisinopril-hydrochlorothiazide (PRINZIDE,ZESTORETIC) 20-12.5 MG per tablet TAKE 1 TABLET BY MOUTH DAILY.   [DISCONTINUED] meloxicam (MOBIC) 15 MG tablet One tab PO qAM with breakfast for 2 weeks, then daily prn pain.   [DISCONTINUED] Multiple Vitamins-Minerals (MULTIVITAMIN PO) Take by mouth.   [DISCONTINUED] simvastatin (ZOCOR) 80 MG tablet TAKE 0.5 TABLETS (40 MG TOTAL) BY MOUTH AT BEDTIME.   [DISCONTINUED] tamoxifen (NOLVADEX) 20 MG tablet    [DISCONTINUED] traMADol (ULTRAM) 50 MG tablet    No facility-administered encounter medications on file as of 06/18/2022.    History obtained from review of EMR, discussion with facility staff/caregiver and/or patient.    BMP: Component 05/29/22 05/28/22 05/27/22 05/26/22 05/25/22  Sodium 139 137 139 138 137  Potassium 3.0 Low  3.5  4.1  3.3 Low  3.4  Chloride 107 106 108 High  105 103  CO2 22 21 24 24 25   Anion Gap 10 10 7 9 9   Glucose, Random 84 104 High  105 High  89 109 High   Blood Urea Nitrogen (BUN) 27 High  34 High  34 High  32 High  27 High   Creatinine 1.50 High  1.69 High  1.76 High  1.76 High  1.80 High   eGFR 33 Low   29 Low   27 Low   27 Low   27 Low    Calcium 8.6 8.4 Low  8.4 Low  7.9 Low  8.1 Low   BUN/Creatinine Ratio 18.0 20.1 High   19.3     Component 05/29/22 05/28/22 05/27/22 05/26/22  Magnesium 1.7 Low  1.9 1.9 1.3 Low    CBC: Component 05/29/22 05/28/22 05/28/22 05/27/22 05/27/22 05/26/22  WBC 2.62 Low  4.37 Low  5.72 -- 4.44 --  RBC 2.44 Low  2.31 Low  2.29 Low  -- 2.46 Low  --  Hemoglobin 7.5 Low  7.3 Low  7.2 Low  7.7 Low  7.8 Low  8.3 Low   Hematocrit 22.7 Low  21.5 Low  21.1 Low  22.7 Low  22.4 Low  24.6 Low   Mean Corpuscular Volume (MCV) 92.9 93.1 92.2 -- 91.3 --  Mean Corpuscular Hemoglobin (MCH) 30.8 31.5 31.5 -- 31.9 --  Mean Corpuscular Hemoglobin Conc (MCHC) 33.2 33.9 34.2 -- 35.0 --  Red Cell Distribution Width (RDW) 18.6 High  18.2 High  18.6 High  -- 19.0 High  --  Platelet Count (PLT) 293 296 267  -- 169 --  Mean Platelet Volume (MPV) 8.0 8.0 8.9 -- 8.7 --    CMP: Component 03/29/22 03/28/22 03/27/22 03/26/22 03/25/22 03/24/22  HX SODIUM 137 139 138 140 138 138  HX POTASSIUM 3.9 3.4 Low  3.7 3.4 Low  3.5 3.9  HX CHLORIDE 105 107 107 106 106 105  HX CO2 25 26 23 24 24 25   HX BUN 19 21 23 22 25  High  26 High   HX GLUCOSE 98  99  88  89  101 High   134 High    HX CREATININE 1.54 High  1.60 High  1.76 High  1.95 High  2.14 High  2.21 High   HX CALCIUM 8.7 8.6 8.1 Low  8.7 8.7 8.7  HX TOTAL PROTEIN 5.6 Low   5.5 Low   5.0 Low   -- -- --  HX ALBUMIN 3.3 Low  3.2 Low  2.9 Low  -- -- --  HX BILIRUBIN TOTAL 0.8  0.7  0.6  -- -- --  HX ALKALINE PHOSPHATASE  71 73 67 -- -- --  HX AST (SGOT) 39 42 High  45 High  -- -- --  HX ALT (SGPT) 26 30 30  -- -- --  HX ANION GAP (WC) 7 6 8 10 8 8   HX EST. GFR 32 Low   31 Low   27 Low   24 Low   22 Low   21 Low     Component Ref Range & Units 2 mo ago  Free Kappa 3.30 - 19.40 mg/L 48.30 High   FREE LAMBDA 5.71 - 26.30 mg/L 43.67 High   KAPPA/LAMBDA RATIO 0.26 - 1.65 1.11  Resulting Agency [facility]  Specimen Collected: 03/24/22 12:01   Performed by: [facility] Last Resulted: 03/25/22 16:16  Received From: [facility] visits prior to 05/04/2022.    Component Ref Range & Units 2 mo ago  PATHOLOGIST CHARGE ID [id number]  Total Protein 6.4 - 8.9 G/DL 5.1 Low   WUJWJ-1-BJYNWGNF 0.1 - 0.4 G/DL 0.4  AOZHY-8-MVHQIONG 0.4 - 1.2 G/DL 0.5  Beta-1- Globulin 0.4 - 0.7 G/DL 0.3 Low   Beta-2- Globulin 0.1 - 0.4 G/DL 0.3  Gamma Globulin 0.5 - 1.6 G/DL 0.6  Comment SPE NO M SPIKE SEEN.  Reviewed and Interpreted By Lissa Merlin M.D.  Albumin 3.5 - 5.0 G/DL 3.0 Low    Specimen: Blood Component Ref Range & Units 2 yr ago  RA Quant 0.0 - 13.9 IU/mL 22.4 High   Resulting Agency [facility]  Narrative Performed by [facility] Performed at:  [address] [place] [address], [place], [place]  [id number] Lab Director: Jolene Schimke MD, Phone:  [phone] Specimen Collected: 01/20/20 08:54   Performed by: [facility] Last Resulted: 01/21/20 05:36  Received From: [facility]  Result Received: 06/14/22 13:41   Urinalysis:  Component 05/25/22 03/19/22 09/19/20  Color, Urine Yellow -- --  Clarity, Urine Cloudy Abnormal  -- --  Specific Gravity, Urine 1.009 -- --  pH, Urine 6.0 -- --  Protein, Urine 300 Abnormal  -- --  Glucose, Urine Negative -- --  Ketones, Urine Negative -- --  Bilirubin, Urine Negative Negative Negative  Blood, Urine Negative 2+ Abnormal  Negative  Nitrite, Urine Negative -- --  Leukocyte Esterase, Urine  250 Abnormal  Negative Negative  Urobilinogen, Urine Normal -- --  WBC, Urine 21-30 Abnormal  -- --  RBC, Urine 3-5 Abnormal  -- --  Bacteria, Urine Many Abnormal  -- --  Squamous Epithelial Cells, Urine 0-5 -- --     05/25/22 CXR 1 view: No acute cardiopulmonary process  05/25/22  CT head without contrast: INDINGS: Brain: No evidence of acute infarction, hemorrhage, hydrocephalus, extra-axial collection or mass lesion/mass effect. There is moderate cerebral volume loss with associated ex vacuo dilatation. Periventricular white matter hypoattenuation likely represents chronic small vessel ischemic disease. CSF collection along the left paramedian frontal lobe appears unchanged. Vascular: There are vascular calcifications in the carotid siphons. Skull: Normal. Negative for fracture or focal lesion. Sinuses/Orbits: There is sinus disease in the bilateral ethmoid, bilateral maxillary, and right sphenoid sinuses. Other: None.   1. No acute intracranial process.  2. Paranasal sinus disease.   I reviewed available labs, medications, imaging, studies and related documents from the EMR.  Records reviewed and summarized above.   Physical Exam: GENERAL: NAD LUNGS: CTAB, no increased work of breathing, room air CARDIAC:  S1S2, RRR with no MRG, No edema/cyanosis ABD:  Normo-active BS x 4 quads, soft, non-tender EXTREMITIES: Normal ROM, no deformity, strength equal, No muscle atrophy/subcutaneous fat loss NEURO:  Noted mild cognitive impairment with short term memory deficits  PSYCH:  non-anxious affect, A & O x 1-2  Thank you for the opportunity to participate in the care of Emilene Starrett. Please call our main office at [phone] if we can be of additional assistance.    Joycelyn Man FNP-C  [email or web address] .[facility]  Phone:  [phone]

## 2022-07-23 ENCOUNTER — Non-Acute Institutional Stay: Payer: Medicare Other | Admitting: Family Medicine

## 2022-07-23 ENCOUNTER — Encounter: Payer: Self-pay | Admitting: Family Medicine

## 2022-07-23 VITALS — BP 128/60 | HR 91 | Temp 98.2°F | Resp 16

## 2022-07-23 DIAGNOSIS — D500 Iron deficiency anemia secondary to blood loss (chronic): Secondary | ICD-10-CM

## 2022-07-23 DIAGNOSIS — E43 Unspecified severe protein-calorie malnutrition: Secondary | ICD-10-CM

## 2022-07-23 DIAGNOSIS — G3184 Mild cognitive impairment, so stated: Secondary | ICD-10-CM

## 2022-07-23 NOTE — Progress Notes (Signed)
Therapist, nutritional Palliative Care Consult Note Telephone: [phone]  Fax: [phone]   Date of encounter: 07/23/22 3:28 PM PATIENT NAME: Joanne Shaw [address] [place] [address] [address] [place] [address]   [phone] (home)  DOB: 1930-04-26 MRN: [id number] PRIMARY CARE PROVIDER:    No primary care provider on file.,  No primary provider on file. None  REFERRING PROVIDER:   No referring provider defined for this encounter. N/A  Emergency Contact:    Contact Information     Name Relation Home Work [place] Other  [phone]        Health Care POA/Health Care Agent Son, Joanne Shaw  I met face to face with patient and son Joanne Shaw in [facility] facility. Palliative Care was asked to follow this patient by consultation request of No ref. provider found to address advance care planning and complex medical decision making. This is a follow up visit.    CODE STATUS: Full code at present  ASSESSMENT AND / RECOMMENDATIONS:  PPS: 40%  Anemia due to blood loss Vital signs stable, no reported blood loss Continue Ferrous sulfate 325 mg daily on Tues, Thurs and Sat with Vitamin C 250 mg.   Severe protein calorie malnutrition Improving weight up by 4 lbs in the last month Continue Mirtazapine, does not like Might[employer]akes, drinking 1-2 Ensures per day. Try different supplements to see if pt has another like ensure pudding or magic cup that she will take.  3.  Mild cognitive impairment with memory loss Vitamin B12 normal at 621, TSH normal at 2.92 on 03/20/22 CT head of 05/29/22 showed moderate cerebral volume loss with associated ex vacuo dilatation.  Periventricular white matter hypoattenuation likely represents chronic small vessel ischemic disease. Do not see a hx of RPR and pt should have formal assessment such as MMSE or MoCA. Use written notes as reminders Keep calendars posted to orient to days.     Follow up Palliative Care  Visit:  Palliative Care continuing to follow up by monitoring for changes in appetite, weight, functional and cognitive status for chronic disease progression and management in agreement with patient's stated goals of care. Next visit in 4 weeks or prn.  This visit was coded based on medical decision making (MDM).  Chief Complaint  Palliative Care received a referral to follow up with pt who has a hx of dementia and right breast cancer.  HISTORY OF PRESENT ILLNESS:  Joanne Shaw is a 89 y.o. year old female with hx of Alzheimer's Dementia with anxiety and hx of breast cancer s/p right mastectomy.   She was using a walker at home prior to admission. Facility staff indicates that she is getting Ensure Plus BID nutritional supplement. She does not like the [employer] and has been refusing those.  She had an unwitnessed fall on 07/12/22 with no serious injury when attempting to transfer from bed to wheelchair.  Exhibiting deficits in short term memory.  Denies CP, SOB, nausea/vomiting, dysuria or constipation.  States she has had a fall but does not remember when and was not injured.  She reports chronic right knee OA pain which son says is "bone on bone".  She states pain is worse with movement but does get some relief from use of heating pad.  Has RA which is why she takes Methotrexate.    ACTIVITIES OF DAILY LIVING: CONTINENT OF BLADDER/BOWEL? Yes BATHING/DRESSING/FEEDING: requires standby assistance for bathing/dressing, independent with feeding  MOBILITY:   INDEPENDENTLY AMBULATORY/AMBULATORY WITH  ASSISTIVE DEVICE: Dan Humphreys with short distances and assistance, otherwise uses wc   APPETITE? good WEIGHT: 102 lbs as of 07/22/22, 99.8 lbs as of 06/29/22, weight 106 lbs on 05/25/22  CURRENT PROBLEM LIST:  Patient Active Problem List   Diagnosis Date Noted   Anemia due to blood loss 06/30/2022   Severe protein-calorie malnutrition (HCC) 06/30/2022   Stage 3b chronic kidney disease (HCC)  06/30/2022   Hypokalemia 06/30/2022   Hypomagnesemia 06/30/2022   POSTMENOPAUSAL STATUS 03/06/2010   NEOPLASM, MALIGNANT, RIGHT BREAST 01/16/2010   MAMMOGRAM, ABNORMAL, RIGHT 11/20/2009   OSTEOARTHRITIS, KNEE, RIGHT 07/13/2009   HYPOTHYROIDISM 05/30/2009   HYPERLIPIDEMIA 05/30/2009   HYPERTENSION 05/30/2009   PAST MEDICAL HISTORY:  Active Ambulatory Problems    Diagnosis Date Noted   NEOPLASM, MALIGNANT, RIGHT BREAST 01/16/2010   HYPOTHYROIDISM 05/30/2009   HYPERLIPIDEMIA 05/30/2009   HYPERTENSION 05/30/2009   OSTEOARTHRITIS, KNEE, RIGHT 07/13/2009   MAMMOGRAM, ABNORMAL, RIGHT 11/20/2009   POSTMENOPAUSAL STATUS 03/06/2010   Anemia due to blood loss 06/30/2022   Severe protein-calorie malnutrition (HCC) 06/30/2022   Stage 3b chronic kidney disease (HCC) 06/30/2022   Hypokalemia 06/30/2022   Hypomagnesemia 06/30/2022   Resolved Ambulatory Problems    Diagnosis Date Noted   UNSPECIFIED INFECTIVE OTITIS EXTERNA 05/30/2009   Headache(784.0) 05/30/2009   UTI (lower urinary tract infection) 07/04/2010   Synovitis of left fifth metacarpophalangeal joint. 11/20/2011   Past Medical History:  Diagnosis Date   Cancer (HCC) 11-11   Hyperlipidemia    Hypertension    Hypothyroidism    Occipital neuralgia    Osteopenia    Right knee DJD    SOCIAL HX:  Social History   Tobacco Use   Smoking status: Former    Types: Cigarettes    Quit date: 03/04/1998    Years since quitting: 24.4   Smokeless tobacco: Not on file  Substance Use Topics   Alcohol use: Yes    Alcohol/week: 7.0 standard drinks of alcohol    Types: 7 Glasses of wine per week    Comment: with dinner   FAMILY HX: No family history on file.     Preferred Pharmacy: ALLERGIES:  Allergies  Allergen Reactions   Amlodipine    Codeine Sulfate     REACTION: N/V   Demerol [Meperidine] Nausea Only   Lisinopril    Oxycodone    Penicillins     REACTION: swelling     PERTINENT MEDICATIONS:  Outpatient Encounter  Medications as of 07/23/2022  Medication Sig   Acetaminophen 500 MG PACK Take 500 mg by mouth every 6 (six) hours as needed (pain). Oral liquid is 500 mg/15 ml   amLODipine (NORVASC) 5 MG tablet Take 5 mg by mouth daily.   ascorbic acid (VITAMIN C) 250 MG tablet Take 250 mg by mouth as directed. One daily Tues, Thurs and Sat for iron absorption   aspirin 81 MG tablet Take 81 mg by mouth daily.     atorvastatin (LIPITOR) 40 MG tablet Take 40 mg by mouth at bedtime.   DM-Phenylephrine-Acetaminophen (GNP COLD RELIEF MULTI-SYMPTOM) 10-5-325 MG TABS Take 2 tablets by mouth every 6 (six) hours as needed (cold symptoms).   ferrous sulfate 325 (65 FE) MG tablet Take 325 mg by mouth as directed. Daily with breakfast on Tues, Thurs, Sat   fluticasone (FLONASE) 50 MCG/ACT nasal spray Place 1 spray into both nostrils daily.   hydrALAZINE (APRESOLINE) 10 MG tablet Take 10 mg by mouth every 8 (eight) hours as needed (for SBP > 180).  levothyroxine (SYNTHROID) 88 MCG tablet Take 88 mcg by mouth daily before breakfast.   loratadine (CLARITIN) 10 MG tablet Take 10 mg by mouth daily.   Menthol-Methyl Salicylate (SALONPAS PAIN RELIEF PATCH) 3-10 % PTCH Apply 1 patch topically every 8 (eight) hours as needed (pain, remove after 8 hours).   methotrexate (RHEUMATREX) 2.5 MG tablet Take 10 mg by mouth once a week. Caution:Chemotherapy. Protect from light. On Fridays   metoprolol succinate (TOPROL-XL) 50 MG 24 hr tablet TAKE 1 TABLET BY MOUTH EVERY DAY   mirtazapine (REMERON) 7.5 MG tablet Take 7.5 mg by mouth at bedtime. Appetite stimulation   Multiple Vitamins-Minerals (PRESERVISION/LUTEIN PO) Take 1 tablet by mouth daily.   Nutritional Supplements (ENSURE ORIGINAL) LIQD Take 1 Can by mouth 2 (two) times daily. Protein calorie malnutrition   ondansetron (ZOFRAN) 4 MG tablet Take 4 mg by mouth every 6 (six) hours as needed for nausea or vomiting.   pantoprazole (PROTONIX) 40 MG tablet Take 40 mg by mouth daily.    polyethylene glycol (MIRALAX / GLYCOLAX) 17 g packet Take 17 g by mouth daily.   sertraline (ZOLOFT) 50 MG tablet Take 50 mg by mouth daily.   No facility-administered encounter medications on file as of 07/23/2022.    History obtained from review of EMR, discussion with son, facility staff/caregiver and/or patient.  I reviewed available labs, medications, imaging, studies and related documents from the EMR. Imaging results and prior labs reviewed above.  Physical Exam: GENERAL: NAD LUNGS: CTAB, no increased work of breathing, room air CARDIAC:  S1S2, IRIR, rate controlled with no MRG, No cyanosis, 1+ ankle edema bilaterally ABD:  Normo-active BS x 4 quads, soft, non-tender EXTREMITIES: Normal ROM, no deformity, strength equal, No muscle atrophy/subcutaneous fat loss NEURO:  Noted mild cognitive impairment with short term memory deficits  PSYCH:  non-anxious affect, A & O x 1-2  Thank you for the opportunity to participate in the care of Joanne Shaw. Please call our main office at [phone] if we can be of additional assistance.    Joycelyn Man FNP-C  [facility]  Phone:  [phone]

## 2022-12-09 ENCOUNTER — Emergency Department (HOSPITAL_BASED_OUTPATIENT_CLINIC_OR_DEPARTMENT_OTHER): Payer: Medicare Other

## 2022-12-09 ENCOUNTER — Other Ambulatory Visit: Payer: Self-pay

## 2022-12-09 ENCOUNTER — Encounter (HOSPITAL_BASED_OUTPATIENT_CLINIC_OR_DEPARTMENT_OTHER): Payer: Self-pay | Admitting: Emergency Medicine

## 2022-12-09 ENCOUNTER — Inpatient Hospital Stay (HOSPITAL_BASED_OUTPATIENT_CLINIC_OR_DEPARTMENT_OTHER)
Admission: EM | Admit: 2022-12-09 | Discharge: 2022-12-13 | DRG: 683 | Disposition: A | Discharge status: Nursing Home (Includes ICF) | Payer: Medicare Other | Source: Skilled Nursing Facility | Attending: Internal Medicine | Admitting: Internal Medicine

## 2022-12-09 DIAGNOSIS — S0003XA Contusion of scalp, initial encounter: Secondary | ICD-10-CM | POA: Diagnosis present

## 2022-12-09 DIAGNOSIS — Z79899 Other long term (current) drug therapy: Secondary | ICD-10-CM

## 2022-12-09 DIAGNOSIS — W1830XA Fall on same level, unspecified, initial encounter: Secondary | ICD-10-CM | POA: Diagnosis present

## 2022-12-09 DIAGNOSIS — W19XXXA Unspecified fall, initial encounter: Principal | ICD-10-CM

## 2022-12-09 DIAGNOSIS — I129 Hypertensive chronic kidney disease with stage 1 through stage 4 chronic kidney disease, or unspecified chronic kidney disease: Secondary | ICD-10-CM | POA: Diagnosis present

## 2022-12-09 DIAGNOSIS — I700 Atherosclerosis of aorta: Secondary | ICD-10-CM | POA: Diagnosis present

## 2022-12-09 DIAGNOSIS — G309 Alzheimer's disease, unspecified: Secondary | ICD-10-CM | POA: Diagnosis present

## 2022-12-09 DIAGNOSIS — I10 Essential (primary) hypertension: Secondary | ICD-10-CM | POA: Diagnosis present

## 2022-12-09 DIAGNOSIS — Z885 Allergy status to narcotic agent status: Secondary | ICD-10-CM

## 2022-12-09 DIAGNOSIS — Z853 Personal history of malignant neoplasm of breast: Secondary | ICD-10-CM

## 2022-12-09 DIAGNOSIS — Z23 Encounter for immunization: Secondary | ICD-10-CM

## 2022-12-09 DIAGNOSIS — E039 Hypothyroidism, unspecified: Secondary | ICD-10-CM | POA: Diagnosis present

## 2022-12-09 DIAGNOSIS — Y9389 Activity, other specified: Secondary | ICD-10-CM

## 2022-12-09 DIAGNOSIS — N179 Acute kidney failure, unspecified: Secondary | ICD-10-CM | POA: Diagnosis not present

## 2022-12-09 DIAGNOSIS — S0512XA Contusion of eyeball and orbital tissues, left eye, initial encounter: Secondary | ICD-10-CM | POA: Diagnosis present

## 2022-12-09 DIAGNOSIS — E785 Hyperlipidemia, unspecified: Secondary | ICD-10-CM | POA: Diagnosis present

## 2022-12-09 DIAGNOSIS — D631 Anemia in chronic kidney disease: Secondary | ICD-10-CM | POA: Diagnosis present

## 2022-12-09 DIAGNOSIS — Z7982 Long term (current) use of aspirin: Secondary | ICD-10-CM

## 2022-12-09 DIAGNOSIS — Z79890 Hormone replacement therapy (postmenopausal): Secondary | ICD-10-CM

## 2022-12-09 DIAGNOSIS — S0012XA Contusion of left eyelid and periocular area, initial encounter: Secondary | ICD-10-CM | POA: Diagnosis present

## 2022-12-09 DIAGNOSIS — Z880 Allergy status to penicillin: Secondary | ICD-10-CM

## 2022-12-09 DIAGNOSIS — Z87891 Personal history of nicotine dependence: Secondary | ICD-10-CM

## 2022-12-09 DIAGNOSIS — M1611 Unilateral primary osteoarthritis, right hip: Secondary | ICD-10-CM | POA: Diagnosis present

## 2022-12-09 DIAGNOSIS — Z9011 Acquired absence of right breast and nipple: Secondary | ICD-10-CM

## 2022-12-09 DIAGNOSIS — F0284 Dementia in other diseases classified elsewhere, unspecified severity, with anxiety: Secondary | ICD-10-CM | POA: Diagnosis present

## 2022-12-09 DIAGNOSIS — Z888 Allergy status to other drugs, medicaments and biological substances status: Secondary | ICD-10-CM

## 2022-12-09 DIAGNOSIS — E876 Hypokalemia: Secondary | ICD-10-CM | POA: Diagnosis present

## 2022-12-09 DIAGNOSIS — N1832 Chronic kidney disease, stage 3b: Secondary | ICD-10-CM | POA: Diagnosis present

## 2022-12-09 DIAGNOSIS — F02A4 Dementia in other diseases classified elsewhere, mild, with anxiety: Secondary | ICD-10-CM | POA: Diagnosis present

## 2022-12-09 HISTORY — DX: Unspecified dementia, unspecified severity, without behavioral disturbance, psychotic disturbance, mood disturbance, and anxiety: F03.90

## 2022-12-09 HISTORY — DX: Unspecified osteoarthritis, unspecified site: M19.90

## 2022-12-09 LAB — URINALYSIS, ROUTINE W REFLEX MICROSCOPIC
Bilirubin Urine: NEGATIVE
Glucose, UA: NEGATIVE mg/dL
Ketones, ur: NEGATIVE mg/dL
Leukocytes,Ua: NEGATIVE
Nitrite: NEGATIVE
Protein, ur: >=300 mg/dL — AB
Specific Gravity, Urine: 1.02 (ref 1.005–1.030)
pH: 7 (ref 5.0–8.0)

## 2022-12-09 LAB — CBC
HCT: 24.9 % — ABNORMAL LOW (ref 36.0–46.0)
Hemoglobin: 7.8 g/dL — ABNORMAL LOW (ref 12.0–15.0)
MCH: 31.2 pg (ref 26.0–34.0)
MCHC: 31.3 g/dL (ref 30.0–36.0)
MCV: 99.6 fL (ref 80.0–100.0)
Platelets: 419 10*3/uL — ABNORMAL HIGH (ref 150–400)
RBC: 2.5 MIL/uL — ABNORMAL LOW (ref 3.87–5.11)
RDW: 17.3 % — ABNORMAL HIGH (ref 11.5–15.5)
WBC: 5.9 10*3/uL (ref 4.0–10.5)
nRBC: 0 % (ref 0.0–0.2)

## 2022-12-09 LAB — COMPREHENSIVE METABOLIC PANEL
ALT: 11 U/L (ref 0–44)
AST: 24 U/L (ref 15–41)
Albumin: 3.5 g/dL (ref 3.5–5.0)
Alkaline Phosphatase: 126 U/L (ref 38–126)
Anion gap: 11 (ref 5–15)
BUN: 36 mg/dL — ABNORMAL HIGH (ref 8–23)
CO2: 22 mmol/L (ref 22–32)
Calcium: 8.8 mg/dL — ABNORMAL LOW (ref 8.9–10.3)
Chloride: 103 mmol/L (ref 98–111)
Creatinine, Ser: 3.22 mg/dL — ABNORMAL HIGH (ref 0.44–1.00)
GFR, Estimated: 13 mL/min — ABNORMAL LOW (ref >60)
Glucose, Bld: 105 mg/dL — ABNORMAL HIGH (ref 70–99)
Potassium: 3.3 mmol/L — ABNORMAL LOW (ref 3.5–5.1)
Sodium: 136 mmol/L (ref 135–145)
Total Bilirubin: 0.4 mg/dL (ref 0.3–1.2)
Total Protein: 7.1 g/dL (ref 6.5–8.1)

## 2022-12-09 LAB — URINALYSIS, MICROSCOPIC (REFLEX)

## 2022-12-09 MED ORDER — PANTOPRAZOLE SODIUM 40 MG PO TBEC
40.0000 mg | DELAYED_RELEASE_TABLET | Freq: Every day | ORAL | Status: DC
  Administered 2022-12-10 – 2022-12-13 (×4): 40 mg via ORAL
  Filled 2022-12-09 (×4): qty 1

## 2022-12-09 MED ORDER — ACETAMINOPHEN 500 MG PO TABS
500.0000 mg | ORAL_TABLET | Freq: Four times a day (QID) | ORAL | Status: DC | PRN
  Administered 2022-12-11 – 2022-12-13 (×3): 500 mg via ORAL
  Filled 2022-12-09 (×3): qty 1

## 2022-12-09 MED ORDER — POLYETHYLENE GLYCOL 3350 17 G PO PACK
17.0000 g | PACK | Freq: Every day | ORAL | Status: DC
  Administered 2022-12-10 – 2022-12-13 (×4): 17 g via ORAL
  Filled 2022-12-09 (×4): qty 1

## 2022-12-09 MED ORDER — HYDRALAZINE HCL 10 MG PO TABS: 10.0000 mg | ORAL_TABLET | Freq: Three times a day (TID) | ORAL | Status: DC | PRN

## 2022-12-09 MED ORDER — SODIUM CHLORIDE 0.9 % IV BOLUS
500.0000 mL | Freq: Once | INTRAVENOUS | Status: AC
  Administered 2022-12-09: 500 mL via INTRAVENOUS

## 2022-12-09 MED ORDER — AMLODIPINE BESYLATE 5 MG PO TABS
5.0000 mg | ORAL_TABLET | Freq: Every day | ORAL | Status: DC
  Administered 2022-12-10 – 2022-12-13 (×4): 5 mg via ORAL
  Filled 2022-12-09 (×4): qty 1

## 2022-12-09 MED ORDER — LEVOTHYROXINE SODIUM 88 MCG PO TABS
88.0000 ug | ORAL_TABLET | Freq: Every day | ORAL | Status: DC
  Administered 2022-12-11 – 2022-12-13 (×3): 88 ug via ORAL
  Filled 2022-12-09 (×4): qty 1

## 2022-12-09 MED ORDER — MIRTAZAPINE 15 MG PO TABS
7.5000 mg | ORAL_TABLET | Freq: Every day | ORAL | Status: DC
  Administered 2022-12-10 – 2022-12-12 (×3): 7.5 mg via ORAL
  Filled 2022-12-09 (×4): qty 1

## 2022-12-09 MED ORDER — HALOPERIDOL LACTATE 5 MG/ML IJ SOLN
2.0000 mg | Freq: Once | INTRAMUSCULAR | Status: AC
  Administered 2022-12-09: 2 mg via INTRAVENOUS
  Filled 2022-12-09: qty 1

## 2022-12-09 MED ORDER — SERTRALINE HCL 50 MG PO TABS
50.0000 mg | ORAL_TABLET | Freq: Every day | ORAL | Status: DC
  Administered 2022-12-10 – 2022-12-13 (×4): 50 mg via ORAL
  Filled 2022-12-09: qty 1
  Filled 2022-12-09: qty 2
  Filled 2022-12-09 (×2): qty 1

## 2022-12-09 MED ORDER — ASPIRIN 81 MG PO TBEC
81.0000 mg | DELAYED_RELEASE_TABLET | Freq: Every day | ORAL | Status: DC
  Administered 2022-12-10 – 2022-12-13 (×4): 81 mg via ORAL
  Filled 2022-12-09 (×4): qty 1

## 2022-12-09 MED ORDER — METOPROLOL SUCCINATE ER 50 MG PO TB24
50.0000 mg | ORAL_TABLET | Freq: Every day | ORAL | Status: DC
  Administered 2022-12-10 – 2022-12-13 (×4): 50 mg via ORAL
  Filled 2022-12-09: qty 1
  Filled 2022-12-09: qty 2
  Filled 2022-12-09 (×2): qty 1

## 2022-12-09 MED ORDER — ACETAMINOPHEN 500 MG PO TABS
ORAL_TABLET | ORAL | Status: AC
  Administered 2022-12-09: 500 mg via ORAL
  Filled 2022-12-09: qty 1

## 2022-12-09 NOTE — ED Notes (Signed)
Fall risk armband Fall risk socks Fall risk sign on door

## 2022-12-09 NOTE — Progress Notes (Signed)
Hospitalist Transfer Note:    Nursing staff, Please call [facility] Admits & Consults System-Wide number on Amion [phone]) as soon as patient's arrival, so appropriate admitting provider can evaluate the pt.  Transferring facility: [facility] Requesting provider: Dr. Ernie Avena (EDP at [facility]) Reason for transfer: admission for further evaluation and management of aki.    13 F w/ dementia, chronic anemia, who presented to [facility] ED from memory care facility after ground-level mechanical fall, in which she hit the left portion of her head, but without any loss of consciousness.  Not on any blood thinners.   On exam, there was there was evidence of a hematoma over the left forehead, with a negative ensuing traumatic imaging workup, including CT head which showed no evidence of acute intracranial process.   Labs were notable for creatinine of 3.5, which is relative to the most recent prior value of 1.5 in March 2024.  Additionally, CBC showed hemoglobin of 7.8, which was reported to be consistent with her baseline hemoglobin.   EDP discussed patient's case with the patient's POA, who is her son.   Subsequently, I accepted this patient for transfer for inpatient admission to a med-tele bed at [facility] or [facility] (first available) for further work-up and management of the above.      Newton Pigg, DO Hospitalist

## 2022-12-09 NOTE — ED Notes (Signed)
Pt transported to imaging.

## 2022-12-09 NOTE — ED Triage Notes (Signed)
Pt arrived via EMS:  Pt is from [facility].  Pt had unwitnessed fall in her room.  Pt is non-ambulatory.  Pt states she was in her bed at the time and heard a sound like a telephone and tried to answer it.  Pt was found on the floor by staff.  Pt has hematoma over her left eye, c/o right leg pain.  C-collar is in place.  Pt is alert but confused which is her baseline.

## 2022-12-09 NOTE — ED Provider Notes (Signed)
[facility] Provider Note   CSN: [id number] Arrival date & time: 12/09/22  1622     History {Add pertinent medical, surgical, social history, OB history to HPI:1} No chief complaint on file.   Joanne Shaw is a 89 y.o. female.  HPI     Home Medications Prior to Admission medications   Medication Sig Start Date End Date Taking? Authorizing Provider  Acetaminophen 500 MG PACK Take 500 mg by mouth every 6 (six) hours as needed (pain). Oral liquid is 500 mg/15 ml    [provider]  amLODipine (NORVASC) 5 MG tablet Take 5 mg by mouth daily.    [provider]  ascorbic acid (VITAMIN C) 250 MG tablet Take 250 mg by mouth as directed. One daily Tues, Thurs and Sat for iron absorption    [provider]  aspirin 81 MG tablet Take 81 mg by mouth daily.      [provider]  atorvastatin (LIPITOR) 40 MG tablet Take 40 mg by mouth at bedtime.    [provider]  DM-Phenylephrine-Acetaminophen (GNP COLD RELIEF MULTI-SYMPTOM) 10-5-325 MG TABS Take 2 tablets by mouth every 6 (six) hours as needed (cold symptoms).    [provider]  ferrous sulfate 325 (65 FE) MG tablet Take 325 mg by mouth as directed. Daily with breakfast on Tues, Thurs, Sat    [provider]  fluticasone (FLONASE) 50 MCG/ACT nasal spray Place 1 spray into both nostrils daily.    [provider]  hydrALAZINE (APRESOLINE) 10 MG tablet Take 10 mg by mouth every 8 (eight) hours as needed (for SBP > 180).    [provider]  levothyroxine (SYNTHROID) 88 MCG tablet Take 88 mcg by mouth daily before breakfast.    [provider]  loratadine (CLARITIN) 10 MG tablet Take 10 mg by mouth daily.    [provider]  Menthol-Methyl Salicylate (SALONPAS PAIN RELIEF PATCH) 3-10 % PTCH Apply 1 patch topically every 8 (eight) hours as needed (pain, remove after 8 hours).    [provider]   methotrexate (RHEUMATREX) 2.5 MG tablet Take 10 mg by mouth once a week. Caution:Chemotherapy. Protect from light. On Fridays    [provider]  metoprolol succinate (TOPROL-XL) 50 MG 24 hr tablet TAKE 1 TABLET BY MOUTH EVERY DAY 08/24/12   Agapito Games, MD  mirtazapine (REMERON) 7.5 MG tablet Take 7.5 mg by mouth at bedtime. Appetite stimulation    [provider]  Multiple Vitamins-Minerals (PRESERVISION/LUTEIN PO) Take 1 tablet by mouth daily.    [provider]  Nutritional Supplements (ENSURE ORIGINAL) LIQD Take 1 Can by mouth 2 (two) times daily. Protein calorie malnutrition    [provider]  ondansetron (ZOFRAN) 4 MG tablet Take 4 mg by mouth every 6 (six) hours as needed for nausea or vomiting.    [provider]  pantoprazole (PROTONIX) 40 MG tablet Take 40 mg by mouth daily.    [provider]  polyethylene glycol (MIRALAX / GLYCOLAX) 17 g packet Take 17 g by mouth daily.    [provider]  sertraline (ZOLOFT) 50 MG tablet Take 50 mg by mouth daily.    [provider]      Allergies    Amlodipine, Codeine sulfate, Demerol [meperidine], Lisinopril, Oxycodone, and Penicillins    Review of Systems   Review of Systems  Physical Exam Updated Vital Signs There were no vitals taken for this visit. Physical  Exam  ED Results / Procedures / Treatments   Labs (all labs ordered are listed, but only abnormal results are displayed) Labs Reviewed - No data to display  EKG None  Radiology No results found.  Procedures Procedures  {Document cardiac monitor, telemetry assessment procedure when appropriate:1}  Medications Ordered in ED Medications - No data to display  ED Course/ Medical Decision Making/ A&P   {   Click here for ABCD2, HEART and other calculatorsREFRESH Note before signing :1}                              Medical Decision Making  ***  {Document critical care time when  appropriate:1} {Document review of labs and clinical decision tools ie heart score, Chads2Vasc2 etc:1}  {Document your independent review of radiology images, and any outside records:1} {Document your discussion with family members, caretakers, and with consultants:1} {Document social determinants of health affecting pt's care:1} {Document your decision making why or why not admission, treatments were needed:1} Final Clinical Impression(s) / ED Diagnoses Final diagnoses:  None    Rx / DC Orders ED Discharge Orders     None

## 2022-12-09 NOTE — ED Notes (Signed)
Pt to [facility] with 2 person assistance.  Pts gown changed, bed linens changed, tele leads reapplied.  Pt assisted back to bed. Clean brief applied.  Call bell within reach, no further needs expressed.

## 2022-12-09 NOTE — ED Notes (Signed)
Pt continously pulling at lines, calling out, insistent that she leaves.  Multiple attempts made by this RN and fellow staff to redirect pt, reorient pt, provided low stimulus environment.  EDP [place] made aware.

## 2022-12-10 DIAGNOSIS — E785 Hyperlipidemia, unspecified: Secondary | ICD-10-CM | POA: Diagnosis present

## 2022-12-10 DIAGNOSIS — F02A4 Dementia in other diseases classified elsewhere, mild, with anxiety: Secondary | ICD-10-CM | POA: Diagnosis present

## 2022-12-10 DIAGNOSIS — N179 Acute kidney failure, unspecified: Secondary | ICD-10-CM

## 2022-12-10 DIAGNOSIS — Y9389 Activity, other specified: Secondary | ICD-10-CM | POA: Diagnosis not present

## 2022-12-10 DIAGNOSIS — M1611 Unilateral primary osteoarthritis, right hip: Secondary | ICD-10-CM | POA: Diagnosis present

## 2022-12-10 DIAGNOSIS — D631 Anemia in chronic kidney disease: Secondary | ICD-10-CM | POA: Diagnosis present

## 2022-12-10 DIAGNOSIS — Z87891 Personal history of nicotine dependence: Secondary | ICD-10-CM | POA: Diagnosis not present

## 2022-12-10 DIAGNOSIS — E876 Hypokalemia: Secondary | ICD-10-CM | POA: Diagnosis present

## 2022-12-10 DIAGNOSIS — Z880 Allergy status to penicillin: Secondary | ICD-10-CM | POA: Diagnosis not present

## 2022-12-10 DIAGNOSIS — Z885 Allergy status to narcotic agent status: Secondary | ICD-10-CM | POA: Diagnosis not present

## 2022-12-10 DIAGNOSIS — W1830XA Fall on same level, unspecified, initial encounter: Secondary | ICD-10-CM | POA: Diagnosis present

## 2022-12-10 DIAGNOSIS — Z853 Personal history of malignant neoplasm of breast: Secondary | ICD-10-CM | POA: Diagnosis not present

## 2022-12-10 DIAGNOSIS — N1832 Chronic kidney disease, stage 3b: Secondary | ICD-10-CM | POA: Diagnosis present

## 2022-12-10 DIAGNOSIS — Z23 Encounter for immunization: Secondary | ICD-10-CM | POA: Diagnosis not present

## 2022-12-10 DIAGNOSIS — G309 Alzheimer's disease, unspecified: Secondary | ICD-10-CM | POA: Diagnosis present

## 2022-12-10 DIAGNOSIS — Z7982 Long term (current) use of aspirin: Secondary | ICD-10-CM | POA: Diagnosis not present

## 2022-12-10 DIAGNOSIS — Z79890 Hormone replacement therapy (postmenopausal): Secondary | ICD-10-CM | POA: Diagnosis not present

## 2022-12-10 DIAGNOSIS — Z888 Allergy status to other drugs, medicaments and biological substances status: Secondary | ICD-10-CM | POA: Diagnosis not present

## 2022-12-10 DIAGNOSIS — S0512XA Contusion of eyeball and orbital tissues, left eye, initial encounter: Secondary | ICD-10-CM | POA: Diagnosis present

## 2022-12-10 DIAGNOSIS — I700 Atherosclerosis of aorta: Secondary | ICD-10-CM | POA: Diagnosis present

## 2022-12-10 DIAGNOSIS — S0003XA Contusion of scalp, initial encounter: Secondary | ICD-10-CM | POA: Diagnosis present

## 2022-12-10 DIAGNOSIS — S0012XA Contusion of left eyelid and periocular area, initial encounter: Secondary | ICD-10-CM | POA: Diagnosis present

## 2022-12-10 DIAGNOSIS — E039 Hypothyroidism, unspecified: Secondary | ICD-10-CM | POA: Diagnosis present

## 2022-12-10 DIAGNOSIS — Z9011 Acquired absence of right breast and nipple: Secondary | ICD-10-CM | POA: Diagnosis not present

## 2022-12-10 DIAGNOSIS — Z79899 Other long term (current) drug therapy: Secondary | ICD-10-CM | POA: Diagnosis not present

## 2022-12-10 DIAGNOSIS — I129 Hypertensive chronic kidney disease with stage 1 through stage 4 chronic kidney disease, or unspecified chronic kidney disease: Secondary | ICD-10-CM | POA: Diagnosis present

## 2022-12-10 LAB — COMPREHENSIVE METABOLIC PANEL
ALT: 12 U/L (ref 0–44)
AST: 21 U/L (ref 15–41)
Albumin: 3.3 g/dL — ABNORMAL LOW (ref 3.5–5.0)
Alkaline Phosphatase: 115 U/L (ref 38–126)
Anion gap: 8 (ref 5–15)
BUN: 32 mg/dL — ABNORMAL HIGH (ref 8–23)
CO2: 21 mmol/L — ABNORMAL LOW (ref 22–32)
Calcium: 8.6 mg/dL — ABNORMAL LOW (ref 8.9–10.3)
Chloride: 107 mmol/L (ref 98–111)
Creatinine, Ser: 2.97 mg/dL — ABNORMAL HIGH (ref 0.44–1.00)
GFR, Estimated: 14 mL/min — ABNORMAL LOW (ref >60)
Glucose, Bld: 116 mg/dL — ABNORMAL HIGH (ref 70–99)
Potassium: 3.3 mmol/L — ABNORMAL LOW (ref 3.5–5.1)
Sodium: 136 mmol/L (ref 135–145)
Total Bilirubin: 0.3 mg/dL (ref 0.3–1.2)
Total Protein: 6.6 g/dL (ref 6.5–8.1)

## 2022-12-10 LAB — CBC WITH DIFFERENTIAL/PLATELET
Abs Immature Granulocytes: 0.02 10*3/uL (ref 0.00–0.07)
Basophils Absolute: 0 10*3/uL (ref 0.0–0.1)
Basophils Relative: 1 %
Eosinophils Absolute: 0 10*3/uL (ref 0.0–0.5)
Eosinophils Relative: 1 %
HCT: 24 % — ABNORMAL LOW (ref 36.0–46.0)
Hemoglobin: 7.4 g/dL — ABNORMAL LOW (ref 12.0–15.0)
Immature Granulocytes: 0 %
Lymphocytes Relative: 28 %
Lymphs Abs: 1.8 10*3/uL (ref 0.7–4.0)
MCH: 31.1 pg (ref 26.0–34.0)
MCHC: 30.8 g/dL (ref 30.0–36.0)
MCV: 100.8 fL — ABNORMAL HIGH (ref 80.0–100.0)
Monocytes Absolute: 0.9 10*3/uL (ref 0.1–1.0)
Monocytes Relative: 15 %
Neutro Abs: 3.6 10*3/uL (ref 1.7–7.7)
Neutrophils Relative %: 55 %
Platelets: 394 10*3/uL (ref 150–400)
RBC: 2.38 MIL/uL — ABNORMAL LOW (ref 3.87–5.11)
RDW: 17.4 % — ABNORMAL HIGH (ref 11.5–15.5)
WBC: 6.3 10*3/uL (ref 4.0–10.5)
nRBC: 0 % (ref 0.0–0.2)

## 2022-12-10 LAB — PHOSPHORUS: Phosphorus: 3.6 mg/dL (ref 2.5–4.6)

## 2022-12-10 LAB — MAGNESIUM: Magnesium: 1.6 mg/dL — ABNORMAL LOW (ref 1.7–2.4)

## 2022-12-10 MED ORDER — ONDANSETRON HCL 4 MG/2ML IJ SOLN: 4.0000 mg | Freq: Four times a day (QID) | INTRAMUSCULAR | Status: DC | PRN

## 2022-12-10 MED ORDER — POTASSIUM CHLORIDE CRYS ER 20 MEQ PO TBCR
20.0000 meq | EXTENDED_RELEASE_TABLET | Freq: Once | ORAL | Status: AC
  Administered 2022-12-10: 20 meq via ORAL
  Filled 2022-12-10: qty 1

## 2022-12-10 MED ORDER — MAGNESIUM SULFATE 2 GM/50ML IV SOLN
2.0000 g | Freq: Once | INTRAVENOUS | Status: AC
  Administered 2022-12-10: 2 g via INTRAVENOUS
  Filled 2022-12-10: qty 50

## 2022-12-10 MED ORDER — ONDANSETRON HCL 4 MG PO TABS: 4.0000 mg | ORAL_TABLET | Freq: Four times a day (QID) | ORAL | Status: DC | PRN

## 2022-12-10 NOTE — Plan of Care (Signed)

## 2022-12-10 NOTE — H&P (Signed)
History and Physical    Patient: Joanne Shaw WJX:[id number] DOB: 1930/04/16 DOA: 12/09/2022 DOS: the patient was seen and examined on 12/10/2022 PCP: No primary care provider on file.  Patient coming from: ALF/ILF  Chief Complaint:  Chief Complaint  Patient presents with   Fall   HPI: Joanne Shaw is a 89 y.o. female with medical history significant of right breast cancer, chronic arthritis, hyperlipidemia, hypertension, hypothyroidism, occipital neuralgia, osteopenia, senile dementia who had an unwitnessed fall at her facility while trying to reach for her phone injuring her left orbital area developing a hematoma and periorbital ecchymosis.  She is disoriented and unable to elaborate.  She is able to answer simple questions.  Has a mild left-sided headache, but she denied pain anywhere else at this time.  No dyspnea or nausea.  Lab work: Urinalysis with trace hemoglobin and greater than 300 protein.  There was rare bacteria microscopic examination and presence of budding yeast.  CMP showed a potassium of 3.3 mmol/L but normal electrolytes otherwise after calcium correction.  LFTs unremarkable.  Glucose 105, BUN 36 and creatinine 3.22 mg/dL.  CBC showed a white count of 5.9, hemoglobin 5.8 g/dL platelets 213.  Imaging: CT head without contrast with no acute intracranial normality.  There is a soft tissue hematoma along the frontal scalp.  No evidence of underlying calvarial fracture.  CT cervical spine with no acute fracture or malalignment of the cervical spine.  Portable 1 view chest radiograph with chronic appearing interstitial opacity.  Portable pelvis x-ray with possible Accu right inferior pubic ramus fracture.  Prior screw fixation of the right femur with chronic appearing femoral neck and hip deformity.  Advanced osteoarthritis of the right hip.  Right knee x-ray with no definite fracture.  CT abdomen/pelvis without contrast negative for acute intra-abdominal or pelvic abnormality.   There were gallstones.  Diverticular disease without diverticulitis.  Chronic appearing fracture deformities of the right femoral neck, right inferior pubic ramus, right ischial tuberosity and sacrum.  Aortic atherosclerosis.  ED course: Initial vital signs were temperature 97.6 F, pulse 62, respiration 18, BP 203/76 mmHg O2 sat 99% on room air.  The patient received Haldol 2 mg IVP and 500 mL of normal saline.  Review of Systems: As mentioned in the history of present illness. All other systems reviewed and are negative.  Past Medical History:  Diagnosis Date   Cancer (HCC) 01/02/2010   right breast (Dr Kathaleen Grinder)   Chronic arthritis    Hyperlipidemia    Hypertension    Hypothyroidism    Occipital neuralgia    30 yrs ago   Osteopenia    Right knee DJD    Senile dementia [facility])    Past Surgical History:  Procedure Laterality Date   APPENDECTOMY     BREAST SURGERY  2-12   right breast segmental mastectomy- Dr Lafayette Dragon   TUBAL LIGATION     Social History:  reports that she quit smoking about 24 years ago. Her smoking use included cigarettes. She does not have any smokeless tobacco history on file. She reports current alcohol use of about 7.0 standard drinks of alcohol per week. She reports that she does not use drugs.  Allergies  Allergen Reactions   Amlodipine    Codeine Sulfate Nausea And Vomiting   Demerol [Meperidine] Nausea Only   Lisinopril    Oxycodone    Penicillins Swelling    History reviewed. No pertinent family history.  Prior to Admission medications   Medication Sig Start Date End  Date Taking? Authorizing Provider  Acetaminophen 500 MG PACK Take 500 mg by mouth every 6 (six) hours as needed (pain). Oral liquid is 500 mg/15 ml    [provider]  amLODipine (NORVASC) 5 MG tablet Take 5 mg by mouth daily.    [provider]  ascorbic acid (VITAMIN C) 250 MG tablet Take 250 mg by mouth as directed. One daily Tues, Thurs and Sat for iron absorption     [provider]  aspirin 81 MG tablet Take 81 mg by mouth daily.      [provider]  atorvastatin (LIPITOR) 40 MG tablet Take 40 mg by mouth at bedtime.    [provider]  DM-Phenylephrine-Acetaminophen (GNP COLD RELIEF MULTI-SYMPTOM) 10-5-325 MG TABS Take 2 tablets by mouth every 6 (six) hours as needed (cold symptoms).    [provider]  ferrous sulfate 325 (65 FE) MG tablet Take 325 mg by mouth as directed. Daily with breakfast on Tues, Thurs, Sat    [provider]  fluticasone (FLONASE) 50 MCG/ACT nasal spray Place 1 spray into both nostrils daily.    [provider]  hydrALAZINE (APRESOLINE) 10 MG tablet Take 10 mg by mouth every 8 (eight) hours as needed (for SBP > 180).    [provider]  levothyroxine (SYNTHROID) 88 MCG tablet Take 88 mcg by mouth daily before breakfast.    [provider]  loratadine (CLARITIN) 10 MG tablet Take 10 mg by mouth daily.    [provider]  Menthol-Methyl Salicylate (SALONPAS PAIN RELIEF PATCH) 3-10 % PTCH Apply 1 patch topically every 8 (eight) hours as needed (pain, remove after 8 hours).    [provider]  methotrexate (RHEUMATREX) 2.5 MG tablet Take 10 mg by mouth once a week. Caution:Chemotherapy. Protect from light. On Fridays    [provider]  metoprolol succinate (TOPROL-XL) 50 MG 24 hr tablet TAKE 1 TABLET BY MOUTH EVERY DAY 08/24/12   Agapito Games, MD  mirtazapine (REMERON) 7.5 MG tablet Take 7.5 mg by mouth at bedtime. Appetite stimulation    [provider]  Multiple Vitamins-Minerals (PRESERVISION/LUTEIN PO) Take 1 tablet by mouth daily.    [provider]  Nutritional Supplements (ENSURE ORIGINAL) LIQD Take 1 Can by mouth 2 (two) times daily. Protein calorie malnutrition    [provider]  ondansetron (ZOFRAN) 4 MG tablet Take 4 mg by mouth every 6 (six) hours as needed for nausea or vomiting.    [provider]  pantoprazole (PROTONIX) 40 MG tablet Take 40 mg by mouth daily.    [provider]  polyethylene glycol (MIRALAX / GLYCOLAX) 17 g packet Take 17 g by mouth daily.    [provider]  sertraline (ZOLOFT) 50 MG tablet Take 50 mg by mouth daily.    [provider]    Physical Exam: Vitals:   12/10/22 0804 12/10/22 1000 12/10/22 1030 12/10/22 1140  BP: (!) 163/62 (!) 167/72 (!) 163/70 (!) 165/65  Pulse: 63 60 63 66  Resp: 16 16 16 16   Temp: 98.3 F (36.8 C) 98.3 F (36.8 C) 98.3 F (36.8 C) 98.6 F (37 C)  TempSrc: Oral  Oral Oral  SpO2: 98% 95% 95% 99%  Weight:      Height:       Physical Exam Vitals and nursing note reviewed.  Constitutional:      General: She is awake. She is not in acute distress.    Appearance: Normal appearance.  She is normal weight. She is ill-appearing.  HENT:     Head: Normocephalic.     Nose: No rhinorrhea.     Mouth/Throat:     Mouth: Mucous membranes are moist.  Eyes:     General: No scleral icterus.    Pupils: Pupils are equal, round, and reactive to light.  Neck:     Vascular: No JVD.  Cardiovascular:     Rate and Rhythm: Normal rate.     Heart sounds: S1 normal and S2 normal.  Pulmonary:     Effort: Pulmonary effort is normal.     Breath sounds: Normal breath sounds. No wheezing, rhonchi or rales.  Abdominal:     General: Bowel sounds are normal. There is no distension.     Palpations: Abdomen is soft.     Tenderness: There is no abdominal tenderness.  Musculoskeletal:     Cervical back: Neck supple.     Right lower leg: No edema.     Left lower leg: No edema.     Comments: Moderate to severe generalized weakness.  Skin:    General: Skin is warm and dry.     Findings: Bruising present.  Neurological:     General: No focal deficit present.     Mental Status: She is alert. She is disoriented.  Psychiatric:        Behavior: Behavior is cooperative.    Data Reviewed:  Results are  pending, will review when available.  Assessment and Plan: Principal Problem:   AKI (acute kidney injury) (HCC) Superimposed on   Stage 3b chronic kidney disease (HCC) Observation/telemetry. Continue IV fluids. Avoid hypotension. Avoid nephrotoxins. Monitor intake and output. Monitor renal function electrolytes.  Active Problems:   Periorbital contusion of left eye  Local care and analgesics as needed.    Hypokalemia Replaced. Follow level in the morning.    Hypomagnesemia Replaced.    Alzheimer's dementia with anxiety (HCC) Continue sertraline 50 mg p.o. daily. Supportive care.    Hypothyroidism Continue levothyroxine.    Essential hypertension Continue amlodipine, hydralazine and metoprolol.   Advance Care Planning:   Code Status: Full Code   Consults:   Family Communication:   Severity of Illness: The appropriate patient status for this patient is INPATIENT. Inpatient status is judged to be reasonable and necessary in order to provide the required intensity of service to ensure the patient's safety. The patient's presenting symptoms, physical exam findings, and initial radiographic and laboratory data in the context of their chronic comorbidities is felt to place them at high risk for further clinical deterioration. Furthermore, it is not anticipated that the patient will be medically stable for discharge from the hospital within 2 midnights of admission.   * I certify that at the point of admission it is my clinical judgment that the patient will require inpatient hospital care spanning beyond 2 midnights from the point of admission due to high intensity of service, high risk for further deterioration and high frequency of surveillance required.*  Author: Bobette Mo, MD 12/10/2022 12:34 PM  For on call review [email or web address].   This document was prepared using Dragon voice recognition software and may contain some unintended transcription errors.

## 2022-12-10 NOTE — ED Notes (Signed)
Oatmeal Hot chocolate  Water

## 2022-12-11 DIAGNOSIS — N179 Acute kidney failure, unspecified: Secondary | ICD-10-CM | POA: Diagnosis not present

## 2022-12-11 LAB — CBC
HCT: 23.2 % — ABNORMAL LOW (ref 36.0–46.0)
Hemoglobin: 7.4 g/dL — ABNORMAL LOW (ref 12.0–15.0)
MCH: 31.5 pg (ref 26.0–34.0)
MCHC: 31.9 g/dL (ref 30.0–36.0)
MCV: 98.7 fL (ref 80.0–100.0)
Platelets: 396 10*3/uL (ref 150–400)
RBC: 2.35 MIL/uL — ABNORMAL LOW (ref 3.87–5.11)
RDW: 17.2 % — ABNORMAL HIGH (ref 11.5–15.5)
WBC: 6.2 10*3/uL (ref 4.0–10.5)
nRBC: 0 % (ref 0.0–0.2)

## 2022-12-11 LAB — COMPREHENSIVE METABOLIC PANEL
ALT: 12 U/L (ref 0–44)
AST: 20 U/L (ref 15–41)
Albumin: 3.2 g/dL — ABNORMAL LOW (ref 3.5–5.0)
Alkaline Phosphatase: 108 U/L (ref 38–126)
Anion gap: 9 (ref 5–15)
BUN: 28 mg/dL — ABNORMAL HIGH (ref 8–23)
CO2: 20 mmol/L — ABNORMAL LOW (ref 22–32)
Calcium: 9 mg/dL (ref 8.9–10.3)
Chloride: 106 mmol/L (ref 98–111)
Creatinine, Ser: 2.81 mg/dL — ABNORMAL HIGH (ref 0.44–1.00)
GFR, Estimated: 15 mL/min — ABNORMAL LOW (ref >60)
Glucose, Bld: 90 mg/dL (ref 70–99)
Potassium: 3.5 mmol/L (ref 3.5–5.1)
Sodium: 135 mmol/L (ref 135–145)
Total Bilirubin: 0.5 mg/dL (ref 0.3–1.2)
Total Protein: 6.7 g/dL (ref 6.5–8.1)

## 2022-12-11 MED ORDER — SODIUM CHLORIDE 0.9 % IV SOLN: INTRAVENOUS | Status: AC

## 2022-12-11 MED ORDER — HALOPERIDOL 0.5 MG PO TABS
2.0000 mg | ORAL_TABLET | Freq: Four times a day (QID) | ORAL | Status: DC | PRN
  Administered 2022-12-11: 2 mg via ORAL
  Filled 2022-12-11: qty 4

## 2022-12-11 MED ORDER — INFLUENZA VAC A&B SURF ANT ADJ 0.5 ML IM SUSY
0.5000 mL | PREFILLED_SYRINGE | INTRAMUSCULAR | Status: DC
  Filled 2022-12-11: qty 0.5

## 2022-12-11 NOTE — TOC Initial Note (Signed)
Transition of Care [facility]) - Initial/Assessment Note    Patient Details  Name: Joanne Shaw MRN: [id number] Date of Birth: 31-Oct-1930  [facility] CM/SW Contact:    Joanne Jupiter, LCSW Phone Number: 12/11/2022, 1:54 PM  Clinical Narrative:                  Met with pt today to introduce TOC/ CSW role. Pt lying in bed and is oriented to self, however, not to place or situation.  She does confirm that she is a resident at [facility] and reports she has been there for 2 yrs.  She reports that she bruised her eye in a fall recently but does not know why she is in the hospital.  She very much wants to return to her ALF apt and agrees for CSW to reach out to son and facility on her behalf.  Have spoken with Emergency planning/management officer at [facility], Joanne Shaw [phone]) who confirms pt has been a resident in the ALF (NOT memory care) for 2+ yrs and notes she usually is mobile by self-propelling at seated w/c level but does use RW at times.  Reviewed with her the medical reports and has no concerns about having pt returning directly to ALF apt.     Spoke with pt's son, Joanne Shaw, and reviewed above information shared with Joanne Shaw and he is in full agreement with her return to ALF.  Per MD, pt may be medically ready for dc tomorrow.  Joanne Shaw confirms that facility can provide her dc transportation but requests as much notice as possible of when pt is ready for pick up.  Continue to follow.  Expected Discharge Plan: Assisted Living Barriers to Discharge: No Barriers Identified   Patient Goals and CMS Choice Patient states their goals for this hospitalization and ongoing recovery are:: return to ALF apt          Expected Discharge Plan and Services In-house Referral: Clinical Social Work   Post Acute Care Choice: Resumption of Svcs/PTA Provider Living arrangements for the past 2 months: Assisted Living Facility ([facility])                 DME Arranged: N/A DME Agency: NA                   Prior Living Arrangements/Services Living arrangements for the past 2 months: Assisted Living Facility ([facility]) Lives with:: Facility Resident Patient language and need for interpreter reviewed:: Yes Do you feel safe going back to the place where you live?: Yes      Need for Family Participation in Patient Care: No (Comment) Care giver support system in place?: Yes (comment) Current home services: Other (comment) [facility] Palliative team following at ALF) Criminal Activity/Legal Involvement Pertinent to Current Situation/Hospitalization: No - Comment as needed  Activities of Daily Living   ADL Screening (condition at time of admission) Is the patient deaf or have difficulty hearing?: No Does the patient have difficulty seeing, even when wearing glasses/contacts?: No Does the patient have difficulty concentrating, remembering, or making decisions?: Yes  Permission Sought/Granted Permission sought to share information with : Family Supports, Magazine features editor Permission granted to share information with : Yes, Verbal Permission Granted  Share Information with NAME: son, Joanne Shaw @ [phone]  Permission granted to share info w AGENCY: [facility]        Emotional Assessment Appearance:: Appears stated age Attitude/Demeanor/Rapport: Self-Confident Affect (typically observed): Accepting Orientation: : Oriented to Self  Alcohol / Substance Use: Not Applicable Psych Involvement: No (comment)  Admission diagnosis:  AKI (acute kidney injury) (HCC) [N17.9] Fall, initial encounter [W19.XXXA] Patient Active Problem List   Diagnosis Date Noted   Periorbital contusion of left eye 12/10/2022   AKI (acute kidney injury) (HCC) 12/09/2022   Anemia due to blood loss 06/30/2022   Severe protein-calorie malnutrition (HCC) 06/30/2022   Stage 3b chronic kidney disease (HCC) 06/30/2022   Hypokalemia 06/30/2022   Hypomagnesemia 06/30/2022    Frequent falls 05/26/2022   Alzheimer's dementia with anxiety (HCC) 05/26/2022   RSV infection 05/26/2022   Asymptomatic postmenopausal status 03/06/2010   Malignant neoplasm of female breast (HCC) 01/16/2010   Abnormal mammogram 11/20/2009   OSTEOARTHRITIS, KNEE, RIGHT 07/13/2009   Hypothyroidism 05/30/2009   HYPERLIPIDEMIA 05/30/2009   Essential hypertension 05/30/2009   PCP:  Pcp, No Pharmacy:  No Pharmacies Listed    Social Determinants of Health (SDOH) Social History: SDOH Screenings   Food Insecurity: Patient Unable To Answer (12/10/2022)  Housing: High Risk (12/10/2022)  Transportation Needs: Patient Unable To Answer (12/10/2022)  Utilities: Patient Unable To Answer (12/10/2022)  Financial Resource Strain: Low Risk  (12/23/2021)   Received from [facility], [facility]  Physical Activity: Inactive (12/23/2021)   Received from [facility], [facility]  Social Connections: Moderately Integrated (12/23/2021)   [facility], [facility]  Stress: No Stress Concern Present (12/23/2021)   Received from [facility], [facility]  Tobacco Use: Medium Risk (12/09/2022)   SDOH Interventions: Housing Interventions: Intervention Not Indicated, Other (Comment) (pt from [facility]F)   Readmission Risk Interventions    12/11/2022    1:50 PM  Readmission Risk Prevention Plan  Transportation Screening Complete  PCP or Specialist Appt within 5-7 Days Complete  Home Care Screening Complete  Medication Review (RN CM) Complete

## 2022-12-11 NOTE — Plan of Care (Signed)
Pt had no acute events overnight. Safety precautions maintained throughout shift.   Problem: Education: Goal: Knowledge of General Education information will improve Description: Including pain rating scale, medication(s)/side effects and non-pharmacologic comfort measures Outcome: Progressing   Problem: Health Behavior/Discharge Planning: Goal: Ability to manage health-related needs will improve Outcome: Progressing   Problem: Clinical Measurements: Goal: Ability to maintain clinical measurements within normal limits will improve Outcome: Progressing   Problem: Coping: Goal: Level of anxiety will decrease Outcome: Progressing   Problem: Pain Managment: Goal: General experience of comfort will improve Outcome: Progressing

## 2022-12-11 NOTE — Progress Notes (Signed)
PROGRESS NOTE    Joanne Shaw  WUJ:[id number] DOB: [birth year] DOA: 12/09/2022 PCP: Pcp, No    Brief Narrative:  89 year old with history of breast cancer, chronic arthritis, hypertension, hyperlipidemia and hypothyroidism, dementia and resident of assisted living facility brought to the ER with unwitnessed fall.  In the emergency room hemodynamically stable.  Chemistry were normal.  Skeletal survey with no acute fracture.  She had some agitation.  Found to have AKI so sent to admission.   Assessment & Plan:   Unwitnessed fall with soft tissue injuries: Currently remains stable.  Work with PT OT to determine readiness to go back to assisted living facility.  Skeletal survey was negative.  Pain is controlled.  Acute kidney injury superimposed on chronic kidney disease stage IIIb: Presented with creatinine of 3.2, recent known baseline creatinine of 1.7 in Care Everywhere.  Appetite is fair. Encourage oral intake.  Since her creatinine is still elevated, will need 24 hours of IV fluid support.  Recheck tomorrow morning.  Hypomagnesemia: Replaced.  Chronic medical issues including Alzheimer's dementia with anxiety, supportive care.  Fairly stable.  On sertraline. Hypothyroidism, on levothyroxine. Essential hypertension, on amlodipine hydralazine and metoprolol that is continued.  Will check orthostatic with mobility.   DVT prophylaxis: SCDs Start: 12/10/22 1233   Code Status: Full code Family Communication: None at the bedside Disposition Plan: Status is: Inpatient Remains inpatient appropriate because: Abnormal renal functions     Consultants:  None  Procedures:  None  Antimicrobials:  None   Subjective: Patient seen and examined.  Pleasant and confused.  She is oriented to herself and place.  Patient tells me she fell while playing softball with other residents at [facility].  Objective: Vitals:   12/11/22 0103 12/11/22 0536 12/11/22 1016 12/11/22 1318  BP: (!)  167/91 (!) 179/68 (!) 168/72 (!) 144/81  Pulse: 65 64 63 65  Resp: 18 18  18   Temp: 98.2 F (36.8 C) 98.1 F (36.7 C)  98.3 F (36.8 C)  TempSrc: Oral Oral  Oral  SpO2: 96% 98%  98%  Weight:      Height:        Intake/Output Summary (Last 24 hours) at 12/11/2022 1520 Last data filed at 12/11/2022 0900 Gross per 24 hour  Intake 100 ml  Output 475 ml  Net -375 ml   Filed Weights   12/10/22 0801  Weight: 63.5 kg    Examination:  General exam: Appears calm and comfortable  Alert and awake.  Oriented to herself. Ecchymosis on periorbital area left eye. Respiratory system: Clear to auscultation. Respiratory effort normal.  No added sounds. Cardiovascular system: S1 & S2 heard, RRR.  Gastrointestinal system: Abdomen is nondistended, soft and nontender. No organomegaly or masses felt. Normal bowel sounds heard. Skin: No rashes, lesions or ulcers   Data Reviewed: I have personally reviewed following labs and imaging studies  CBC: Recent Labs  Lab 12/09/22 1653 12/10/22 1328 12/11/22 0611  WBC 5.9 6.3 6.2  NEUTROABS  --  3.6  --   HGB 7.8* 7.4* 7.4*  HCT 24.9* 24.0* 23.2*  MCV 99.6 100.8* 98.7  PLT 419* 394 396   Basic Metabolic Panel: Recent Labs  Lab 12/09/22 1653 12/10/22 1328 12/11/22 0611  NA 136 136 135  K 3.3* 3.3* 3.5  CL 103 107 106  CO2 22 21* 20*  GLUCOSE 105* 116* 90  BUN 36* 32* 28*  CREATININE 3.22* 2.97* 2.81*  CALCIUM 8.8* 8.6* 9.0  MG  --  1.6*  --  PHOS  --  3.6  --    GFR: Estimated Creatinine Clearance: 11.9 mL/min (A) (by C-G formula based on SCr of 2.81 mg/dL (H)). Liver Function Tests: Recent Labs  Lab 12/09/22 1653 12/10/22 1328 12/11/22 0611  AST 24 21 20   ALT 11 12 12   ALKPHOS 126 115 108  BILITOT 0.4 0.3 0.5  PROT 7.1 6.6 6.7  ALBUMIN 3.5 3.3* 3.2*   No results for input(s): "LIPASE", "AMYLASE" in the last 168 hours. No results for input(s): "AMMONIA" in the last 168 hours. Coagulation Profile: No results for  input(s): "INR", "PROTIME" in the last 168 hours. Cardiac Enzymes: No results for input(s): "CKTOTAL", "CKMB", "CKMBINDEX", "TROPONINI" in the last 168 hours. BNP (last 3 results) No results for input(s): "PROBNP" in the last 8760 hours. HbA1C: No results for input(s): "HGBA1C" in the last 72 hours. CBG: No results for input(s): "GLUCAP" in the last 168 hours. Lipid Profile: No results for input(s): "CHOL", "HDL", "LDLCALC", "TRIG", "CHOLHDL", "LDLDIRECT" in the last 72 hours. Thyroid Function Tests: No results for input(s): "TSH", "T4TOTAL", "FREET4", "T3FREE", "THYROIDAB" in the last 72 hours. Anemia Panel: No results for input(s): "VITAMINB12", "FOLATE", "FERRITIN", "TIBC", "IRON", "RETICCTPCT" in the last 72 hours. Sepsis Labs: No results for input(s): "PROCALCITON", "LATICACIDVEN" in the last 168 hours.  No results found for this or any previous visit (from the past 240 hour(s)).       Radiology Studies: CT ABDOMEN PELVIS WO CONTRAST  Result Date: 12/09/2022 CLINICAL DATA:  Unwitnessed fall dementia EXAM: CT ABDOMEN AND PELVIS WITHOUT CONTRAST TECHNIQUE: Multidetector CT imaging of the abdomen and pelvis was performed following the standard protocol without IV contrast. RADIATION DOSE REDUCTION: This exam was performed according to the departmental dose-optimization program which includes automated exposure control, adjustment of the mA and/or kV according to patient size and/or use of iterative reconstruction technique. COMPARISON:  Radiographs 12/09/2022 FINDINGS: Lower chest: Lung bases demonstrate calcified granuloma the left base. Subpleural reticulation and mild fibrosis. No acute airspace disease. Cardiomegaly Hepatobiliary: Gallstones. No focal hepatic abnormality or biliary dilatation Pancreas: Unremarkable. No pancreatic ductal dilatation or surrounding inflammatory changes. Spleen: Normal in size without focal abnormality. Adrenals/Urinary Tract: Adrenal glands are within  normal limits. Probable bilateral renal cysts for which no imaging follow-up is recommended. Subcentimeter hyperdensity posterior lower pole left kidney too small to further characterize, no imaging follow-up is recommended. Distended urinary bladder Stomach/Bowel: Stomach nonenlarged. No dilated small bowel. Diverticular disease of the colon without acute inflammatory process. Vascular/Lymphatic: Advanced aortic atherosclerosis. No aneurysm. No suspicious lymph nodes. Reproductive: Uterus and bilateral adnexa are unremarkable. Other: Negative for pelvic effusion or free air Musculoskeletal: Fixating screws in the proximal right femur with chronic fracture deformity of the right femoral neck. Severe arthritis of the right hip. Chronic right inferior pubic ramus fracture and chronic appearing right ischial fracture. Chronic appearing mid sacral fracture. IMPRESSION: 1. Negative for acute intra-abdominal or pelvic abnormality. 2. Gallstones. Diverticular disease of the colon without acute inflammatory process. 3. Chronic appearing fracture deformities of the right femoral neck, right inferior pubic ramus, right ischial tuberosity, and sacrum. 4. Aortic atherosclerosis. Aortic Atherosclerosis (ICD10-I70.0). Electronically Signed   By: Jasmine Pang M.D.   On: 12/09/2022 21:45   DG Chest Port 1 View  Result Date: 12/09/2022 CLINICAL DATA:  Trauma fall EXAM: PORTABLE CHEST 1 VIEW COMPARISON:  05/25/2022 FINDINGS: Normal cardiac size. Aortic atherosclerosis. Diffuse chronic appearing interstitial opacity. No consolidation, pleural effusion or pneumothorax. Clips in the right perihilar region IMPRESSION: Chronic appearing  interstitial opacity. Electronically Signed   By: Jasmine Pang M.D.   On: 12/09/2022 19:59   DG Pelvis Portable  Result Date: 12/09/2022 CLINICAL DATA:  Unwitnessed fall EXAM: PORTABLE PELVIS 1-2 VIEWS COMPARISON:  None Available. FINDINGS: SI joint are non widened. Pubic symphysis appears  intact. Possible acute right inferior pubic ramus fracture. Prior screw fixation of right femur with chronic appearing femoral neck and head deformity. Advanced arthritis of the right hip. IMPRESSION: 1. Possible acute right inferior pubic ramus fracture. 2. Prior screw fixation of right femur with chronic appearing femoral neck and head deformity. Advanced arthritis of the right hip. Electronically Signed   By: Jasmine Pang M.D.   On: 12/09/2022 19:52   DG Knee Right Port  Result Date: 12/09/2022 CLINICAL DATA:  Found on floor unwitnessed fall EXAM: PORTABLE RIGHT KNEE - 1-2 VIEW COMPARISON:  None Available. FINDINGS: Heterogeneous mineralization potentially due to osteopenia. No convincing fracture or malalignment. No sizeable effusion. Joint space calcification consistent with chondrocalcinosis. IMPRESSION: 1. No definitive fracture is seen. 2. Heterogenous mineralization which could be due to osteopenia or potential marrow disease. Electronically Signed   By: Jasmine Pang M.D.   On: 12/09/2022 19:50   DG FEMUR PORT, MIN 2 VIEWS RIGHT  Result Date: 12/09/2022 CLINICAL DATA:  Unwitnessed fall leg pain EXAM: RIGHT FEMUR PORTABLE 2 VIEW COMPARISON:  None Available. FINDINGS: Prior screw fixation of the proximal left femur with presumed chronic femoral neck fracture deformity. Severe arthritis of the right hip with bone on bone appearance and bony remodeling of the acetabulum and femoral head. No definite acute displaced fracture is seen. Vascular calcifications IMPRESSION: Severe arthritis of the right hip with bone on bone appearance and bony remodeling. No definite acute displaced fracture is seen. Prior screw fixation of proximal right femur for chronic appearing right femoral neck fracture Electronically Signed   By: Jasmine Pang M.D.   On: 12/09/2022 19:49   CT HEAD WO CONTRAST  Result Date: 12/09/2022 CLINICAL DATA:  Head trauma, moderate-severe; Polytrauma, blunt EXAM: CT HEAD WITHOUT CONTRAST  CT CERVICAL SPINE WITHOUT CONTRAST TECHNIQUE: Multidetector CT imaging of the head and cervical spine was performed following the standard protocol without intravenous contrast. Multiplanar CT image reconstructions of the cervical spine were also generated. RADIATION DOSE REDUCTION: This exam was performed according to the departmental dose-optimization program which includes automated exposure control, adjustment of the mA and/or kV according to patient size and/or use of iterative reconstruction technique. COMPARISON:  CT Head 09/19/20, Brain MR 09/19/20 FINDINGS: CT HEAD FINDINGS Brain: There is sequela of severe chronic microvascular ischemic change with advanced generalized volume loss. No hemorrhage. Unchanged size and shape of the ventricular system. No extra-axial fluid collection. No CT evidence of an acute cortical infarct. No mass effect. No mass lesion. Vascular: No hyperdense vessel or unexpected calcification. Skull: Soft tissue hematoma along the left frontal scalp. No evidence of an underlying calvarial fracture. Sinuses/Orbits: No middle ear or mastoid effusion. Paranasal sinuses are clear. Bilateral lens replacement. Orbits are otherwise unremarkable. Other: None. CT CERVICAL SPINE FINDINGS Alignment: Grade 1 anterolisthesis of C2 on C3. Skull base and vertebrae: No acute fracture. No primary bone lesion or focal pathologic process. Soft tissues and spinal canal: No prevertebral fluid or swelling. No visible canal hematoma. Disc levels:  No evidence of high-grade spinal canal stenosis. Upper chest: Negative. Other: None IMPRESSION: 1. No acute intracranial abnormality. 2. Soft tissue hematoma along the left frontal scalp. No evidence of an underlying calvarial fracture. 3.  No acute fracture or traumatic malalignment of the cervical spine. Electronically Signed   By: Lorenza Cambridge M.D.   On: 12/09/2022 18:50   CT CERVICAL SPINE WO CONTRAST  Result Date: 12/09/2022 CLINICAL DATA:  Head trauma,  moderate-severe; Polytrauma, blunt EXAM: CT HEAD WITHOUT CONTRAST CT CERVICAL SPINE WITHOUT CONTRAST TECHNIQUE: Multidetector CT imaging of the head and cervical spine was performed following the standard protocol without intravenous contrast. Multiplanar CT image reconstructions of the cervical spine were also generated. RADIATION DOSE REDUCTION: This exam was performed according to the departmental dose-optimization program which includes automated exposure control, adjustment of the mA and/or kV according to patient size and/or use of iterative reconstruction technique. COMPARISON:  CT Head 09/19/20, Brain MR 09/19/20 FINDINGS: CT HEAD FINDINGS Brain: There is sequela of severe chronic microvascular ischemic change with advanced generalized volume loss. No hemorrhage. Unchanged size and shape of the ventricular system. No extra-axial fluid collection. No CT evidence of an acute cortical infarct. No mass effect. No mass lesion. Vascular: No hyperdense vessel or unexpected calcification. Skull: Soft tissue hematoma along the left frontal scalp. No evidence of an underlying calvarial fracture. Sinuses/Orbits: No middle ear or mastoid effusion. Paranasal sinuses are clear. Bilateral lens replacement. Orbits are otherwise unremarkable. Other: None. CT CERVICAL SPINE FINDINGS Alignment: Grade 1 anterolisthesis of C2 on C3. Skull base and vertebrae: No acute fracture. No primary bone lesion or focal pathologic process. Soft tissues and spinal canal: No prevertebral fluid or swelling. No visible canal hematoma. Disc levels:  No evidence of high-grade spinal canal stenosis. Upper chest: Negative. Other: None IMPRESSION: 1. No acute intracranial abnormality. 2. Soft tissue hematoma along the left frontal scalp. No evidence of an underlying calvarial fracture. 3. No acute fracture or traumatic malalignment of the cervical spine. Electronically Signed   By: Lorenza Cambridge M.D.   On: 12/09/2022 18:50        Scheduled  Meds:  amLODipine  5 mg Oral Daily   aspirin EC  81 mg Oral Daily   influenza vaccine adjuvanted  0.5 mL Intramuscular Tomorrow-1000   levothyroxine  88 mcg Oral QAC breakfast   metoprolol succinate  50 mg Oral Daily   mirtazapine  7.5 mg Oral QHS   pantoprazole  40 mg Oral Daily   polyethylene glycol  17 g Oral Daily   sertraline  50 mg Oral Daily   Continuous Infusions:  sodium chloride 75 mL/hr at 12/11/22 1021     LOS: 1 day    Time spent: 35 minutes    Dorcas Carrow, MD [facility]

## 2022-12-11 NOTE — Plan of Care (Signed)
  Problem: Clinical Measurements: Goal: Respiratory complications will improve Outcome: Progressing Goal: Cardiovascular complication will be avoided Outcome: Progressing   Problem: Nutrition: Goal: Adequate nutrition will be maintained Outcome: Progressing   Problem: Elimination: Goal: Will not experience complications related to urinary retention Outcome: Progressing   Problem: Skin Integrity: Goal: Risk for impaired skin integrity will decrease Outcome: Progressing

## 2022-12-12 DIAGNOSIS — N179 Acute kidney failure, unspecified: Secondary | ICD-10-CM | POA: Diagnosis not present

## 2022-12-12 LAB — BASIC METABOLIC PANEL
Anion gap: 11 (ref 5–15)
BUN: 33 mg/dL — ABNORMAL HIGH (ref 8–23)
CO2: 21 mmol/L — ABNORMAL LOW (ref 22–32)
Calcium: 8.9 mg/dL (ref 8.9–10.3)
Chloride: 105 mmol/L (ref 98–111)
Creatinine, Ser: 2.88 mg/dL — ABNORMAL HIGH (ref 0.44–1.00)
GFR, Estimated: 15 mL/min — ABNORMAL LOW (ref >60)
Glucose, Bld: 83 mg/dL (ref 70–99)
Potassium: 3.2 mmol/L — ABNORMAL LOW (ref 3.5–5.1)
Sodium: 137 mmol/L (ref 135–145)

## 2022-12-12 MED ORDER — SODIUM CHLORIDE 0.9 % IV SOLN: INTRAVENOUS | Status: DC

## 2022-12-12 MED ORDER — POTASSIUM CHLORIDE 20 MEQ PO PACK
20.0000 meq | PACK | Freq: Three times a day (TID) | ORAL | Status: AC
  Administered 2022-12-12 – 2022-12-13 (×4): 20 meq via ORAL
  Filled 2022-12-12 (×4): qty 1

## 2022-12-12 NOTE — Evaluation (Signed)
Physical Therapy Evaluation Patient Details Name: Joanne Shaw MRN: [id number] DOB: 31-Aug-1930 Today's Date: 12/12/2022  History of Present Illness  89 year old with history of breast cancer, chronic arthritis, hypertension, hyperlipidemia and hypothyroidism, dementia and resident of assisted living facility brought to the ER with unwitnessed fall.  In the emergency room hemodynamically stable.  Chemistry were normal.  Skeletal survey with no acute fracture.  She had some agitation.  Found to have AKI  Clinical Impression  Pt admitted with above diagnosis.  Pt currently with functional limitations due to the deficits listed below (see PT Problem List). Pt will benefit from acute skilled PT to increase their independence and safety with mobility to allow discharge.    The patient is pleasant and confused. Patient able to  participate, required mod assist for mobility and min/mod support to ambulate x 20', gait and  balance improved with distance.  Patient will benefit from continued PT at the ALF.         If plan is discharge home, recommend the following: A little help with walking and/or transfers;A little help with bathing/dressing/bathroom;Assistance with cooking/housework;Assist for transportation   Can travel by private vehicle        Equipment Recommendations None recommended by PT  Recommendations for Other Services       Functional Status Assessment Patient has had a recent decline in their functional status and demonstrates the ability to make significant improvements in function in a reasonable and predictable amount of time.     Precautions / Restrictions Precautions Precautions: Fall Precaution Comments: dementia Restrictions Weight Bearing Restrictions: No      Mobility  Bed Mobility Overal bed mobility: Needs Assistance Bed Mobility: Supine to Sit     Supine to sit: Min assist     General bed mobility comments: multimodal cues to  initiate mobility,  assist with legs and trunk, able to scoot to bed edge    Transfers Overall transfer level: Needs assistance Equipment used: Rolling walker (2 wheels) Transfers: Sit to/from Stand, Bed to chair/wheelchair/BSC Sit to Stand: Mod assist   Step pivot transfers: Mod assist       General transfer comment: Assist to stand and steady,multimodal cues , slow and shuffling steps to  [facility]. Assist to rise from  [facility]    Ambulation/Gait Ambulation/Gait assistance: Mod assist Gait Distance (Feet): 20 Feet Assistive device: Rolling walker (2 wheels) Gait Pattern/deviations: Step-to pattern, Shuffle, Trunk flexed Gait velocity: decr     General Gait Details: cues for longer step, position inside Rw.  Stairs            Wheelchair Mobility     Tilt Bed    Modified Rankin (Stroke Patients Only)       Balance Overall balance assessment: History of Falls                                           Pertinent Vitals/Pain Pain Assessment Pain Assessment: No/denies pain    Home Living Family/patient expects to be discharged to:: Assisted living                 Home Equipment: Wheelchair - manual;Grab bars - tub/shower;Grab bars - toilet;Hand held Programmer, systems (2 wheels) Additional Comments: Pt is a resident at [facility]    Prior Function Prior Level of Function : Needs assist  Mobility Comments: Pt has a RW and WC which she will use if needed. Able to walk with no AD. ADLs Comments: Supervision is provided for showers. Staff does laundry and meals for patient. Mod I for dressing, toileting, and grooming.     Extremity/Trunk Assessment   Upper Extremity Assessment Upper Extremity Assessment: Right hand dominant    Lower Extremity Assessment Lower Extremity Assessment: Generalized weakness    Cervical / Trunk Assessment Cervical / Trunk Assessment: Kyphotic  Communication   Communication Communication: No apparent  difficulties  Cognition Arousal: Alert Behavior During Therapy: WFL for tasks assessed/performed Overall Cognitive Status: History of cognitive impairments - at baseline                                 General Comments: able to follow directions, pleasant        General Comments General comments (skin integrity, edema, etc.): henmatoma over left eye    Exercises     Assessment/Plan    PT Assessment Patient needs continued PT services  PT Problem List Decreased strength;Decreased activity tolerance;Decreased mobility;Decreased cognition;Decreased range of motion;Decreased balance;Decreased safety awareness       PT Treatment Interventions DME instruction;Functional mobility training;Patient/family education;Balance training;Therapeutic activities;Gait training;Therapeutic exercise    PT Goals (Current goals can be found in the Care Plan section)  Acute Rehab PT Goals PT Goal Formulation: Patient unable to participate in goal setting Time For Goal Achievement: 12/26/22 Potential to Achieve Goals: Good    Frequency Min 1X/week     Co-evaluation               AM-PAC PT "6 Clicks" Mobility  Outcome Measure Help needed turning from your back to your side while in a flat bed without using bedrails?: A Lot Help needed moving from lying on your back to sitting on the side of a flat bed without using bedrails?: A Lot Help needed moving to and from a bed to a chair (including a wheelchair)?: A Lot Help needed standing up from a chair using your arms (e.g., wheelchair or bedside chair)?: A Lot Help needed to walk in hospital room?: A Lot Help needed climbing 3-5 steps with a railing? : Total 6 Click Score: 11    End of Session Equipment Utilized During Treatment: Gait belt Activity Tolerance: Patient tolerated treatment well Patient left: in chair;with call bell/phone within reach;with chair alarm set Nurse Communication: Mobility status PT Visit Diagnosis:  Unsteadiness on feet (R26.81);History of falling (Z91.81)    Time: 2841-3244 PT Time Calculation (min) (ACUTE ONLY): 16 min   Charges:   PT Evaluation $PT Eval Low Complexity: 1 Low   PT General Charges $$ ACUTE PT VISIT: 1 Visit         Blanchard Kelch PT Acute Rehabilitation Services Office [phone] Weekend [phone]   Rada Hay 12/12/2022, 1:03 PM

## 2022-12-12 NOTE — Progress Notes (Signed)
PROGRESS NOTE    Joanne Shaw  ZOX:[id number] DOB: 1930-11-28 DOA: 12/09/2022 PCP: Pcp, No    Brief Narrative:  89 year old with history of breast cancer, chronic arthritis, hypertension, hyperlipidemia and hypothyroidism, dementia and resident of assisted living facility brought to the ER with unwitnessed fall.  In the emergency room hemodynamically stable.  Chemistry were normal.  Skeletal survey with no acute fracture.  She had some agitation.  Found to have AKI so sent to admission.   Assessment & Plan:   Unwitnessed fall with soft tissue injuries: Currently remains stable.  Work with PT OT.  Patient can likely go back to to assisted living facility.  Skeletal survey was negative.  Pain is controlled.  Acute kidney injury superimposed on chronic kidney disease stage IIIb: Presented with creatinine of 3.2, recent known baseline creatinine of 1.7 in Care Everywhere.  Appetite is fair. Encourage oral intake.  Since her creatinine is still elevated, will need additional 24 hours of IV fluid support.  Recheck tomorrow morning.  Hypomagnesemia: Replaced.  Adequate  Hypokalemia: Replace.  Chronic medical issues including Alzheimer's dementia with anxiety, supportive care.  Fairly stable.  On sertraline. Hypothyroidism, on levothyroxine. Essential hypertension, on amlodipine hydralazine and metoprolol that is continued.  Will check orthostatic with mobility.   DVT prophylaxis: SCDs Start: 12/10/22 1233   Code Status: Full code Family Communication: None at the bedside Disposition Plan: Status is: Inpatient Remains inpatient appropriate because: Abnormal renal functions     Consultants:  None  Procedures:  None  Antimicrobials:  None   Subjective:  Patient seen and examined.  Sometimes she is impulsive with nursing staff however wants to walk.  Denies any complaints.  Objective: Vitals:   12/11/22 1555 12/11/22 2029 12/12/22 0711 12/12/22 1302  BP:  (!) 168/65  (!) 151/76 (!) 141/59  Pulse:  (!) 54 (!) 57 (!) 56  Resp: 20 18  15   Temp:  97.6 F (36.4 C) 97.7 F (36.5 C) 97.6 F (36.4 C)  TempSrc:  Oral Oral Oral  SpO2:  100% 98% 100%  Weight:      Height:        Intake/Output Summary (Last 24 hours) at 12/12/2022 1309 Last data filed at 12/12/2022 0943 Gross per 24 hour  Intake 1407.25 ml  Output --  Net 1407.25 ml   Filed Weights   12/10/22 0801  Weight: 63.5 kg    Examination:  General exam: Calm and comfortable. Alert and awake.  Oriented to herself. Ecchymosis on periorbital area left eye. Respiratory system: Clear to auscultation. Respiratory effort normal.  No added sounds. Cardiovascular system: S1 & S2 heard, RRR.  Gastrointestinal system: Abdomen is nondistended, soft and nontender. No organomegaly or masses felt. Normal bowel sounds heard. Skin: No rashes, lesions or ulcers   Data Reviewed: I have personally reviewed following labs and imaging studies  CBC: Recent Labs  Lab 12/09/22 1653 12/10/22 1328 12/11/22 0611  WBC 5.9 6.3 6.2  NEUTROABS  --  3.6  --   HGB 7.8* 7.4* 7.4*  HCT 24.9* 24.0* 23.2*  MCV 99.6 100.8* 98.7  PLT 419* 394 396   Basic Metabolic Panel: Recent Labs  Lab 12/09/22 1653 12/10/22 1328 12/11/22 0611 12/12/22 0557  NA 136 136 135 137  K 3.3* 3.3* 3.5 3.2*  CL 103 107 106 105  CO2 22 21* 20* 21*  GLUCOSE 105* 116* 90 83  BUN 36* 32* 28* 33*  CREATININE 3.22* 2.97* 2.81* 2.88*  CALCIUM 8.8* 8.6* 9.0 8.9  MG  --  1.6*  --   --   PHOS  --  3.6  --   --    GFR: Estimated Creatinine Clearance: 11.6 mL/min (A) (by C-G formula based on SCr of 2.88 mg/dL (H)). Liver Function Tests: Recent Labs  Lab 12/09/22 1653 12/10/22 1328 12/11/22 0611  AST 24 21 20   ALT 11 12 12   ALKPHOS 126 115 108  BILITOT 0.4 0.3 0.5  PROT 7.1 6.6 6.7  ALBUMIN 3.5 3.3* 3.2*   No results for input(s): "LIPASE", "AMYLASE" in the last 168 hours. No results for input(s): "AMMONIA" in the last 168  hours. Coagulation Profile: No results for input(s): "INR", "PROTIME" in the last 168 hours. Cardiac Enzymes: No results for input(s): "CKTOTAL", "CKMB", "CKMBINDEX", "TROPONINI" in the last 168 hours. BNP (last 3 results) No results for input(s): "PROBNP" in the last 8760 hours. HbA1C: No results for input(s): "HGBA1C" in the last 72 hours. CBG: No results for input(s): "GLUCAP" in the last 168 hours. Lipid Profile: No results for input(s): "CHOL", "HDL", "LDLCALC", "TRIG", "CHOLHDL", "LDLDIRECT" in the last 72 hours. Thyroid Function Tests: No results for input(s): "TSH", "T4TOTAL", "FREET4", "T3FREE", "THYROIDAB" in the last 72 hours. Anemia Panel: No results for input(s): "VITAMINB12", "FOLATE", "FERRITIN", "TIBC", "IRON", "RETICCTPCT" in the last 72 hours. Sepsis Labs: No results for input(s): "PROCALCITON", "LATICACIDVEN" in the last 168 hours.  No results found for this or any previous visit (from the past 240 hour(s)).       Radiology Studies: No results found.      Scheduled Meds:  amLODipine  5 mg Oral Daily   aspirin EC  81 mg Oral Daily   influenza vaccine adjuvanted  0.5 mL Intramuscular Tomorrow-1000   levothyroxine  88 mcg Oral QAC breakfast   metoprolol succinate  50 mg Oral Daily   mirtazapine  7.5 mg Oral QHS   pantoprazole  40 mg Oral Daily   polyethylene glycol  17 g Oral Daily   potassium chloride  20 mEq Oral TID   sertraline  50 mg Oral Daily   Continuous Infusions:  sodium chloride       LOS: 2 days    Time spent: 35 minutes    Dorcas Carrow, MD [facility]

## 2022-12-12 NOTE — Evaluation (Signed)
Occupational Therapy Evaluation Patient Details Name: Joanne Shaw MRN: [id number] DOB: 12-May-1930 Today's Date: 12/12/2022   History of Present Illness 89 year old with history of breast cancer, chronic arthritis, hypertension, hyperlipidemia and hypothyroidism, dementia and resident of assisted living facility brought to the ER with unwitnessed fall.  In the emergency room hemodynamically stable.  Chemistry were normal.  Skeletal survey with no acute fracture.  Found to have AKI   Clinical Impression   Pt admitted with the above diagnosis. Pt currently with functional limitations due to the deficits listed below (see OT Problem List). Prior to admit, pt was living at [facility] receiving assist from staff as needed for BADL tasks and functional transfers. Primarily uses manual WC for functional mobility.  Pt will benefit from acute skilled OT to increase their safety and independence with ADL and functional mobility for ADL to facilitate discharge. Recommend pt return to ALF with increased assist from staff for ADL and functional transfers with follow up skilled OT services. OT will follow up with pt acutely.         If plan is discharge home, recommend the following: A lot of help with walking and/or transfers;A lot of help with bathing/dressing/bathroom;Supervision due to cognitive status    Functional Status Assessment  Patient has had a recent decline in their functional status and demonstrates the ability to make significant improvements in function in a reasonable and predictable amount of time.  Equipment Recommendations  None recommended by OT       Precautions / Restrictions Precautions Precautions: Fall Precaution Comments: dementia Restrictions Weight Bearing Restrictions: No      Mobility Bed Mobility Overal bed mobility: Needs Assistance Bed Mobility: Supine to Sit     Supine to sit: Mod assist, HOB elevated, Used rails     General bed mobility comments:  Increased time required to complete task. VC provided for sequencing. Pt required assist to bring BLE off bed, VC to hold onto bed rail and assist with sitting upright. Bed pad used to scoot hips towards EOB.    Transfers Overall transfer level: Needs assistance Equipment used: Rolling walker (2 wheels) Transfers: Sit to/from Stand, Bed to chair/wheelchair/BSC Sit to Stand: Mod assist, From elevated surface     Step pivot transfers: Mod assist     General transfer comment: VC for hand placement with RW management. Once standing, pt reported increased pain with weightbearing into R foot. VC provided to step forward with R foot, push into RW handles to off load and step LLE forward. Increased time needed to compelte transfer from bed to recliner.      Balance Overall balance assessment: History of Falls, Needs assistance Sitting-balance support: Bilateral upper extremity supported, Feet supported Sitting balance-Leahy Scale: Poor   Postural control: Left lateral lean Standing balance support: Bilateral upper extremity supported, During functional activity, Reliant on assistive device for balance Standing balance-Leahy Scale: Zero            ADL either performed or assessed with clinical judgement   ADL Overall ADL's : Needs assistance/impaired Eating/Feeding: Set up;Bed level   Grooming: Set up;Sitting   Upper Body Bathing: Minimal assistance;Sitting   Lower Body Bathing: Total assistance;Sit to/from stand   Upper Body Dressing : Minimal assistance;Sitting   Lower Body Dressing: Total assistance;Sit to/from stand   Toilet Transfer: Moderate assistance;Rolling walker (2 wheels) Toilet Transfer Details (indicate cue type and reason): step pivot transfer, simulate Toileting- Clothing Manipulation and Hygiene: Total assistance;Sit to/from stand  Functional mobility during ADLs: Moderate assistance;Cueing for sequencing;Rolling walker (2 wheels);Cueing for safety        Vision Baseline Vision/History: 1 Wears glasses Ability to See in Adequate Light: 0 Adequate Patient Visual Report: No change from baseline Vision Assessment?: Wears glasses for reading     Perception Perception: Not tested       Praxis Praxis: Not tested       Pertinent Vitals/Pain Pain Assessment Pain Assessment: 0-10 Pain Score: 6  Pain Location: R big toe Pain Descriptors / Indicators: Sore Pain Intervention(s): Monitored during session, Limited activity within patient's tolerance, Ice applied, Repositioned     Extremity/Trunk Assessment Upper Extremity Assessment Upper Extremity Assessment: Generalized weakness;Right hand dominant   Lower Extremity Assessment Lower Extremity Assessment: Generalized weakness   Cervical / Trunk Assessment Cervical / Trunk Assessment: Kyphotic   Communication Communication Communication: No apparent difficulties   Cognition Arousal: Alert Behavior During Therapy: WFL for tasks assessed/performed Overall Cognitive Status: History of cognitive impairments - at baseline       General Comments: history of dementia. Follows one step commands consistently.     General Comments  Hematoma over left eye and at left elbow region.            Home Living Family/patient expects to be discharged to:: Assisted living    Home Equipment: Wheelchair - manual;Grab bars - tub/shower;Grab bars - toilet;Hand held Programmer, systems (2 wheels)   Additional Comments: Pt is a resident at [facility]      Prior Functioning/Environment Prior Level of Function : Needs assist  Cognitive Assist : ADLs (cognitive)     Physical Assist : ADLs (physical);Mobility (physical) Mobility (physical): Transfers ADLs (physical): Bathing;Toileting;IADLs Mobility Comments: Has RW that she uses occassionally. Primarily WC bound and able to self propell around without assist. Non-ambulatory. ADLs Comments: Supervision is provided for showers.  Staff does laundry and meals for patient. Mod I for dressing, toileting, and grooming.        OT Problem List: Decreased strength;Decreased coordination;Decreased activity tolerance;Impaired balance (sitting and/or standing);Decreased knowledge of use of DME or AE      OT Treatment/Interventions: Self-care/ADL training;Therapeutic exercise;Therapeutic activities;Patient/family education;DME and/or AE instruction;Manual therapy;Balance training    OT Goals(Current goals can be found in the care plan section) Acute Rehab OT Goals Patient Stated Goal: to go back to ALF OT Goal Formulation: Patient unable to participate in goal setting Time For Goal Achievement: 12/26/22 Potential to Achieve Goals: Fair  OT Frequency: Min 1X/week       AM-PAC OT "6 Clicks" Daily Activity     Outcome Measure Help from another person eating meals?: A Little Help from another person taking care of personal grooming?: A Little Help from another person toileting, which includes using toliet, bedpan, or urinal?: Total Help from another person bathing (including washing, rinsing, drying)?: Total Help from another person to put on and taking off regular upper body clothing?: A Little Help from another person to put on and taking off regular lower body clothing?: Total 6 Click Score: 12   End of Session Equipment Utilized During Treatment: Gait belt;Rolling walker (2 wheels)  Activity Tolerance: Patient tolerated treatment well Patient left: in chair;with chair alarm set;with family/visitor present  OT Visit Diagnosis: History of falling (Z91.81);Muscle weakness (generalized) (M62.81)                Time: 9518-8416 OT Time Calculation (min): 50 min Charges:  OT General Charges $OT Visit: 1 Visit OT Evaluation $OT Eval  High Complexity: 1 High OT Treatments $Self Care/Home Management : 8-22 mins $Therapeutic Activity: 8-22 mins  Limmie Patricia, OTR/L,CBIS  Supplemental OT - [facility] and [facility] Secure Chat  Preferred    Taesha Goodell, Charisse March 12/12/2022, 1:52 PM

## 2022-12-12 NOTE — Plan of Care (Signed)
  Problem: Nutrition: Goal: Adequate nutrition will be maintained Outcome: Progressing   Problem: Pain Managment: Goal: General experience of comfort will improve Outcome: Progressing   Problem: Safety: Goal: Ability to remain free from injury will improve Outcome: Progressing   

## 2022-12-13 DIAGNOSIS — N179 Acute kidney failure, unspecified: Secondary | ICD-10-CM | POA: Diagnosis not present

## 2022-12-13 LAB — BASIC METABOLIC PANEL
Anion gap: 8 (ref 5–15)
BUN: 29 mg/dL — ABNORMAL HIGH (ref 8–23)
CO2: 21 mmol/L — ABNORMAL LOW (ref 22–32)
Calcium: 8.9 mg/dL (ref 8.9–10.3)
Chloride: 108 mmol/L (ref 98–111)
Creatinine, Ser: 2.61 mg/dL — ABNORMAL HIGH (ref 0.44–1.00)
GFR, Estimated: 17 mL/min — ABNORMAL LOW (ref >60)
Glucose, Bld: 88 mg/dL (ref 70–99)
Potassium: 3.5 mmol/L (ref 3.5–5.1)
Sodium: 137 mmol/L (ref 135–145)

## 2022-12-13 NOTE — Plan of Care (Signed)

## 2022-12-13 NOTE — Care Management Important Message (Signed)
Important Message  Patient Details IM Letter given. Name: Joanne Shaw MRN: [id number] Date of Birth: 09-05-1930   Important Message Given:  Yes - Medicare IM     Caren Macadam 12/13/2022, 11:09 AM

## 2022-12-13 NOTE — NC FL2 (Signed)
[place] MEDICAID FL2 LEVEL OF CARE FORM     IDENTIFICATION  Patient Name: Joanne Shaw Birthdate: [birth year] Sex: female Admission Date (Current Location): 12/09/2022  [facility] and [place] Number:  Producer, television/film/video and Address:  [facility],  [address] [place] [address]      Provider Number: [id number]  Attending Physician Name and Address:  Dorcas Carrow, MD  Relative Name and Phone Number:  son, Jaydynn Seyfarth    Current Level of Care: Hospital Recommended Level of Care: Assisted Living Facility Prior Approval Number:    Date Approved/Denied:   PASRR Number:    Discharge Plan: Other (Comment) (ALF)    Current Diagnoses: Patient Active Problem List   Diagnosis Date Noted   Periorbital contusion of left eye 12/10/2022   AKI (acute kidney injury) (HCC) 12/09/2022   Anemia due to blood loss 06/30/2022   Severe protein-calorie malnutrition (HCC) 06/30/2022   Stage 3b chronic kidney disease (HCC) 06/30/2022   Hypokalemia 06/30/2022   Hypomagnesemia 06/30/2022   Frequent falls 05/26/2022   Alzheimer's dementia with anxiety (HCC) 05/26/2022   RSV infection 05/26/2022   Asymptomatic postmenopausal status 03/06/2010   Malignant neoplasm of female breast (HCC) 01/16/2010   Abnormal mammogram 11/20/2009   OSTEOARTHRITIS, KNEE, RIGHT 07/13/2009   Hypothyroidism 05/30/2009   HYPERLIPIDEMIA 05/30/2009   Essential hypertension 05/30/2009    Orientation RESPIRATION BLADDER Height & Weight     Self, Place  Normal Continent Weight: 140 lb (63.5 kg) Height:  5\' 3"  (160 cm)  BEHAVIORAL SYMPTOMS/MOOD NEUROLOGICAL BOWEL NUTRITION STATUS      Continent Diet  AMBULATORY STATUS COMMUNICATION OF NEEDS Skin   Supervision Verbally Normal                       Personal Care Assistance Level of Assistance  Bathing, Dressing Bathing Assistance:  (supervision)   Dressing Assistance: Limited assistance     Functional Limitations Info  Sight,  Hearing, Speech Sight Info: Adequate Hearing Info: Adequate Speech Info: Adequate    SPECIAL CARE FACTORS FREQUENCY  OT (By licensed OT)     PT Frequency: 2x/wk OT Frequency: 2x/wk            Contractures Contractures Info: Not present    Additional Factors Info  Code Status, Allergies Code Status Info: Full Allergies Info: Amlodipine, Codeine Sulfate, Demerol (Meperidine), Lisinopril, Oxycodone, Penicillins           Current Medications (12/13/2022):  This is the current hospital active medication list Current Facility-Administered Medications  Medication Dose Route Frequency Provider Last Rate Last Admin   0.9 %  sodium chloride infusion   Intravenous Continuous Dorcas Carrow, MD 75 mL/hr at 12/13/22 0629 New Bag at 12/13/22 0629   acetaminophen (TYLENOL) tablet 500 mg  500 mg Oral Q6H PRN Ernie Avena, MD   500 mg at 12/13/22 0054   amLODipine (NORVASC) tablet 5 mg  5 mg Oral Daily Ernie Avena, MD   5 mg at 12/12/22 1004   aspirin EC tablet 81 mg  81 mg Oral Daily Ernie Avena, MD   81 mg at 12/12/22 1004   haloperidol (HALDOL) tablet 2 mg  2 mg Oral Q6H PRN Dorcas Carrow, MD   2 mg at 12/11/22 1555   hydrALAZINE (APRESOLINE) tablet 10 mg  10 mg Oral Q8H PRN Ernie Avena, MD       influenza vaccine adjuvanted (FLUAD) injection 0.5 mL  0.5 mL Intramuscular Tomorrow-1000 Dorcas Carrow, MD  levothyroxine (SYNTHROID) tablet 88 mcg  88 mcg Oral QAC breakfast Ernie Avena, MD   88 mcg at 12/12/22 1004   metoprolol succinate (TOPROL-XL) 24 hr tablet 50 mg  50 mg Oral Daily Ernie Avena, MD   50 mg at 12/12/22 1004   mirtazapine (REMERON) tablet 7.5 mg  7.5 mg Oral QHS Ernie Avena, MD   7.5 mg at 12/12/22 2106   ondansetron (ZOFRAN) tablet 4 mg  4 mg Oral Q6H PRN Bobette Mo, MD       Or   ondansetron [facility]) injection 4 mg  4 mg Intravenous Q6H PRN Bobette Mo, MD       pantoprazole (PROTONIX) EC tablet 40 mg  40 mg Oral Daily Ernie Avena, MD   40 mg at 12/12/22 1004   polyethylene glycol (MIRALAX / GLYCOLAX) packet 17 g  17 g Oral Daily Ernie Avena, MD   17 g at 12/12/22 1004   potassium chloride (KLOR-CON) packet 20 mEq  20 mEq Oral TID Dorcas Carrow, MD   20 mEq at 12/12/22 2106   sertraline (ZOLOFT) tablet 50 mg  50 mg Oral Daily Ernie Avena, MD   50 mg at 12/12/22 1004     Discharge Medications: Please see discharge summary for a list of discharge medications.  Relevant Imaging Results:  Relevant Lab Results:   Additional Information    Rola Lennon, LCSW

## 2022-12-13 NOTE — Discharge Summary (Signed)
Physician Discharge Summary  Joanne Shaw WGN:[id number] DOB: 1930-03-28 DOA: 12/09/2022  PCP: Pcp, No  Admit date: 12/09/2022 Discharge date: 12/13/2022  Admitted From: Assisted living facility Disposition: Assisted living facility  Recommendations for Outpatient Follow-up:  Follow up with PCP in 1-2 weeks Please obtain BMP/CBC in one week   Home Health: PT/OT Equipment/Devices: None  Discharge Condition: Stable CODE STATUS: Full code Diet recommendation: Regular diet, nutritional supplements  Discharge summary: 89 year old with mild dementia from ALF with history of breast cancer, chronic arthritis, hypertension, hyperlipidemia and hypothyroidism, resident of assisted living facility brought to the ER with unwitnessed fall.  In the emergency room hemodynamically stable.  Skeletal survey with no acute fracture.  She had some agitation.  Found to have AKI so sent to admission.     Assessment & Plan:   Unwitnessed fall with soft tissue injuries: Currently remains stable.  Work with PT OT.  Skeletal survey was negative.  Pain is controlled. Patient will benefit with ongoing PT OT at assisted living facility.  Fall precautions.   Acute kidney injury superimposed on chronic kidney disease stage IIIb: Presented with creatinine of 3.2, recent known baseline creatinine of 1.7 in Care Everywhere.  Appetite is fair.  Patient was treated with IV fluid.  Her creatinine is slowly trending down.  Urine output is adequate. Creatinine 3.2-2.8-2.6.  Since she has adequate oral intake, she will continue adequate oral hydration at home.  Will benefit with recheck in 1 week to ensure stabilization back to herself.   Hypomagnesemia: Replaced.  Adequate   Hypokalemia: Replaced and adequate.   Chronic medical issues including Alzheimer's dementia with anxiety, supportive care.  Fairly stable.  On sertraline. Hypothyroidism, on levothyroxine. Essential hypertension, on amlodipine hydralazine and  metoprolol that is continued.  Did not have any evidence of orthostatic hypotension.  Patient is fairly stable to discharge back to assisted living facility today.   Discharge Diagnoses:  Principal Problem:   AKI (acute kidney injury) (HCC) Active Problems:   Hypothyroidism   Essential hypertension   Stage 3b chronic kidney disease (HCC)   Hypokalemia   Hypomagnesemia   Alzheimer's dementia with anxiety (HCC)   Periorbital contusion of left eye    Discharge Instructions  Discharge Instructions     Diet - low sodium heart healthy   Complete by: As directed    Discharge instructions   Complete by: As directed    Recheck BMP in one week and follow up with primary care provider   Increase activity slowly   Complete by: As directed       Allergies as of 12/13/2022       Reactions   Amlodipine Other (See Comments)   Reaction not listed on MAR    Codeine Sulfate Nausea And Vomiting   Demerol [meperidine] Nausea Only   Lisinopril Other (See Comments)   Reaction not listed on MAR    Oxycodone Other (See Comments)   Reaction not listed on MAR    Penicillins Swelling        Medication List     TAKE these medications    acetaminophen 500 MG tablet Commonly known as: TYLENOL Take 500 mg by mouth every 8 (eight) hours as needed for moderate pain or fever.   amLODipine 5 MG tablet Commonly known as: NORVASC Take 5 mg by mouth daily.   fluticasone 50 MCG/ACT nasal spray Commonly known as: FLONASE Place 1 spray into both nostrils daily as needed for allergies.   GNP Cold Relief Multi-Symptom 10-5-325  MG Tabs Generic drug: DM-Phenylephrine-Acetaminophen Take 2 tablets by mouth every 6 (six) hours as needed (cold symptoms).   hydrALAZINE 10 MG tablet Commonly known as: APRESOLINE Take 10 mg by mouth every 8 (eight) hours as needed (for SBP > 180).   levothyroxine 88 MCG tablet Commonly known as: SYNTHROID Take 88 mcg by mouth daily before breakfast.    methotrexate 2.5 MG tablet Commonly known as: RHEUMATREX Take 10 mg by mouth once a week. Fridays   metoprolol succinate 50 MG 24 hr tablet Commonly known as: TOPROL-XL TAKE 1 TABLET BY MOUTH EVERY DAY   mirtazapine 30 MG tablet Commonly known as: REMERON Take 30 mg by mouth at bedtime.   ondansetron 4 MG tablet Commonly known as: ZOFRAN Take 4 mg by mouth every 6 (six) hours as needed for nausea or vomiting.   pantoprazole 40 MG tablet Commonly known as: PROTONIX Take 40 mg by mouth daily.   polyethylene glycol 17 g packet Commonly known as: MIRALAX / GLYCOLAX Take 17 g by mouth daily.   PRESERVISION/LUTEIN PO Take 1 tablet by mouth daily.   SALONPAS EX Apply 1 patch topically every 8 (eight) hours as needed (right shoulder pain).   senna 8.6 MG Tabs tablet Commonly known as: SENOKOT Take 17.2 mg by mouth daily as needed for mild constipation or moderate constipation.   sertraline 50 MG tablet Commonly known as: ZOLOFT Take 50 mg by mouth daily.        Allergies  Allergen Reactions   Amlodipine Other (See Comments)    Reaction not listed on MAR    Codeine Sulfate Nausea And Vomiting   Demerol [Meperidine] Nausea Only   Lisinopril Other (See Comments)    Reaction not listed on MAR    Oxycodone Other (See Comments)    Reaction not listed on MAR    Penicillins Swelling    Consultations: None   Procedures/Studies: CT ABDOMEN PELVIS WO CONTRAST  Result Date: 12/09/2022 CLINICAL DATA:  Unwitnessed fall dementia EXAM: CT ABDOMEN AND PELVIS WITHOUT CONTRAST TECHNIQUE: Multidetector CT imaging of the abdomen and pelvis was performed following the standard protocol without IV contrast. RADIATION DOSE REDUCTION: This exam was performed according to the departmental dose-optimization program which includes automated exposure control, adjustment of the mA and/or kV according to patient size and/or use of iterative reconstruction technique. COMPARISON:  Radiographs  12/09/2022 FINDINGS: Lower chest: Lung bases demonstrate calcified granuloma the left base. Subpleural reticulation and mild fibrosis. No acute airspace disease. Cardiomegaly Hepatobiliary: Gallstones. No focal hepatic abnormality or biliary dilatation Pancreas: Unremarkable. No pancreatic ductal dilatation or surrounding inflammatory changes. Spleen: Normal in size without focal abnormality. Adrenals/Urinary Tract: Adrenal glands are within normal limits. Probable bilateral renal cysts for which no imaging follow-up is recommended. Subcentimeter hyperdensity posterior lower pole left kidney too small to further characterize, no imaging follow-up is recommended. Distended urinary bladder Stomach/Bowel: Stomach nonenlarged. No dilated small bowel. Diverticular disease of the colon without acute inflammatory process. Vascular/Lymphatic: Advanced aortic atherosclerosis. No aneurysm. No suspicious lymph nodes. Reproductive: Uterus and bilateral adnexa are unremarkable. Other: Negative for pelvic effusion or free air Musculoskeletal: Fixating screws in the proximal right femur with chronic fracture deformity of the right femoral neck. Severe arthritis of the right hip. Chronic right inferior pubic ramus fracture and chronic appearing right ischial fracture. Chronic appearing mid sacral fracture. IMPRESSION: 1. Negative for acute intra-abdominal or pelvic abnormality. 2. Gallstones. Diverticular disease of the colon without acute inflammatory process. 3. Chronic appearing fracture deformities of the  right femoral neck, right inferior pubic ramus, right ischial tuberosity, and sacrum. 4. Aortic atherosclerosis. Aortic Atherosclerosis (ICD10-I70.0). Electronically Signed   By: Jasmine Pang M.D.   On: 12/09/2022 21:45   DG Chest Port 1 View  Result Date: 12/09/2022 CLINICAL DATA:  Trauma fall EXAM: PORTABLE CHEST 1 VIEW COMPARISON:  05/25/2022 FINDINGS: Normal cardiac size. Aortic atherosclerosis. Diffuse chronic  appearing interstitial opacity. No consolidation, pleural effusion or pneumothorax. Clips in the right perihilar region IMPRESSION: Chronic appearing interstitial opacity. Electronically Signed   By: Jasmine Pang M.D.   On: 12/09/2022 19:59   DG Pelvis Portable  Result Date: 12/09/2022 CLINICAL DATA:  Unwitnessed fall EXAM: PORTABLE PELVIS 1-2 VIEWS COMPARISON:  None Available. FINDINGS: SI joint are non widened. Pubic symphysis appears intact. Possible acute right inferior pubic ramus fracture. Prior screw fixation of right femur with chronic appearing femoral neck and head deformity. Advanced arthritis of the right hip. IMPRESSION: 1. Possible acute right inferior pubic ramus fracture. 2. Prior screw fixation of right femur with chronic appearing femoral neck and head deformity. Advanced arthritis of the right hip. Electronically Signed   By: Jasmine Pang M.D.   On: 12/09/2022 19:52   DG Knee Right Port  Result Date: 12/09/2022 CLINICAL DATA:  Found on floor unwitnessed fall EXAM: PORTABLE RIGHT KNEE - 1-2 VIEW COMPARISON:  None Available. FINDINGS: Heterogeneous mineralization potentially due to osteopenia. No convincing fracture or malalignment. No sizeable effusion. Joint space calcification consistent with chondrocalcinosis. IMPRESSION: 1. No definitive fracture is seen. 2. Heterogenous mineralization which could be due to osteopenia or potential marrow disease. Electronically Signed   By: Jasmine Pang M.D.   On: 12/09/2022 19:50   DG FEMUR PORT, MIN 2 VIEWS RIGHT  Result Date: 12/09/2022 CLINICAL DATA:  Unwitnessed fall leg pain EXAM: RIGHT FEMUR PORTABLE 2 VIEW COMPARISON:  None Available. FINDINGS: Prior screw fixation of the proximal left femur with presumed chronic femoral neck fracture deformity. Severe arthritis of the right hip with bone on bone appearance and bony remodeling of the acetabulum and femoral head. No definite acute displaced fracture is seen. Vascular calcifications  IMPRESSION: Severe arthritis of the right hip with bone on bone appearance and bony remodeling. No definite acute displaced fracture is seen. Prior screw fixation of proximal right femur for chronic appearing right femoral neck fracture Electronically Signed   By: Jasmine Pang M.D.   On: 12/09/2022 19:49   CT HEAD WO CONTRAST  Result Date: 12/09/2022 CLINICAL DATA:  Head trauma, moderate-severe; Polytrauma, blunt EXAM: CT HEAD WITHOUT CONTRAST CT CERVICAL SPINE WITHOUT CONTRAST TECHNIQUE: Multidetector CT imaging of the head and cervical spine was performed following the standard protocol without intravenous contrast. Multiplanar CT image reconstructions of the cervical spine were also generated. RADIATION DOSE REDUCTION: This exam was performed according to the departmental dose-optimization program which includes automated exposure control, adjustment of the mA and/or kV according to patient size and/or use of iterative reconstruction technique. COMPARISON:  CT Head 09/19/20, Brain MR 09/19/20 FINDINGS: CT HEAD FINDINGS Brain: There is sequela of severe chronic microvascular ischemic change with advanced generalized volume loss. No hemorrhage. Unchanged size and shape of the ventricular system. No extra-axial fluid collection. No CT evidence of an acute cortical infarct. No mass effect. No mass lesion. Vascular: No hyperdense vessel or unexpected calcification. Skull: Soft tissue hematoma along the left frontal scalp. No evidence of an underlying calvarial fracture. Sinuses/Orbits: No middle ear or mastoid effusion. Paranasal sinuses are clear. Bilateral lens replacement. Orbits are otherwise  unremarkable. Other: None. CT CERVICAL SPINE FINDINGS Alignment: Grade 1 anterolisthesis of C2 on C3. Skull base and vertebrae: No acute fracture. No primary bone lesion or focal pathologic process. Soft tissues and spinal canal: No prevertebral fluid or swelling. No visible canal hematoma. Disc levels:  No evidence of  high-grade spinal canal stenosis. Upper chest: Negative. Other: None IMPRESSION: 1. No acute intracranial abnormality. 2. Soft tissue hematoma along the left frontal scalp. No evidence of an underlying calvarial fracture. 3. No acute fracture or traumatic malalignment of the cervical spine. Electronically Signed   By: Lorenza Cambridge M.D.   On: 12/09/2022 18:50   CT CERVICAL SPINE WO CONTRAST  Result Date: 12/09/2022 CLINICAL DATA:  Head trauma, moderate-severe; Polytrauma, blunt EXAM: CT HEAD WITHOUT CONTRAST CT CERVICAL SPINE WITHOUT CONTRAST TECHNIQUE: Multidetector CT imaging of the head and cervical spine was performed following the standard protocol without intravenous contrast. Multiplanar CT image reconstructions of the cervical spine were also generated. RADIATION DOSE REDUCTION: This exam was performed according to the departmental dose-optimization program which includes automated exposure control, adjustment of the mA and/or kV according to patient size and/or use of iterative reconstruction technique. COMPARISON:  CT Head 09/19/20, Brain MR 09/19/20 FINDINGS: CT HEAD FINDINGS Brain: There is sequela of severe chronic microvascular ischemic change with advanced generalized volume loss. No hemorrhage. Unchanged size and shape of the ventricular system. No extra-axial fluid collection. No CT evidence of an acute cortical infarct. No mass effect. No mass lesion. Vascular: No hyperdense vessel or unexpected calcification. Skull: Soft tissue hematoma along the left frontal scalp. No evidence of an underlying calvarial fracture. Sinuses/Orbits: No middle ear or mastoid effusion. Paranasal sinuses are clear. Bilateral lens replacement. Orbits are otherwise unremarkable. Other: None. CT CERVICAL SPINE FINDINGS Alignment: Grade 1 anterolisthesis of C2 on C3. Skull base and vertebrae: No acute fracture. No primary bone lesion or focal pathologic process. Soft tissues and spinal canal: No prevertebral fluid or  swelling. No visible canal hematoma. Disc levels:  No evidence of high-grade spinal canal stenosis. Upper chest: Negative. Other: None IMPRESSION: 1. No acute intracranial abnormality. 2. Soft tissue hematoma along the left frontal scalp. No evidence of an underlying calvarial fracture. 3. No acute fracture or traumatic malalignment of the cervical spine. Electronically Signed   By: Lorenza Cambridge M.D.   On: 12/09/2022 18:50   (Echo, Carotid, EGD, Colonoscopy, ERCP)    Subjective: Patient was seen and examined.  Pleasant and interactive.  Denies any complaints.  She is agreeable to go back to [place].   Discharge Exam: Vitals:   12/12/22 2040 12/13/22 0530  BP: (!) 153/63 (!) 176/70  Pulse: (!) 56 (!) 58  Resp: 14 14  Temp: 98.3 F (36.8 C) 97.7 F (36.5 C)  SpO2: 98% 96%   Vitals:   12/12/22 0711 12/12/22 1302 12/12/22 2040 12/13/22 0530  BP: (!) 151/76 (!) 141/59 (!) 153/63 (!) 176/70  Pulse: (!) 57 (!) 56 (!) 56 (!) 58  Resp:  15 14 14   Temp: 97.7 F (36.5 C) 97.6 F (36.4 C) 98.3 F (36.8 C) 97.7 F (36.5 C)  TempSrc: Oral Oral Oral Oral  SpO2: 98% 100% 98% 96%  Weight:      Height:        General: Pt is alert, awake, not in acute distress Patient is mostly alert awake.  Patient is oriented to herself and situation.  Pleasant and interactive. Patient has ecchymosis on her left side of the face. Cardiovascular: RRR, S1/S2 +,  no rubs, no gallops Respiratory: CTA bilaterally, no wheezing, no rhonchi Abdominal: Soft, NT, ND, bowel sounds + Extremities: no edema, no cyanosis    The results of significant diagnostics from this hospitalization (including imaging, microbiology, ancillary and laboratory) are listed below for reference.     Microbiology: No results found for this or any previous visit (from the past 240 hour(s)).   Labs: BNP (last 3 results) No results for input(s): "BNP" in the last 8760 hours. Basic Metabolic Panel: Recent Labs  Lab 12/09/22 1653  12/10/22 1328 12/11/22 0611 12/12/22 0557 12/13/22 0842  NA 136 136 135 137 137  K 3.3* 3.3* 3.5 3.2* 3.5  CL 103 107 106 105 108  CO2 22 21* 20* 21* 21*  GLUCOSE 105* 116* 90 83 88  BUN 36* 32* 28* 33* 29*  CREATININE 3.22* 2.97* 2.81* 2.88* 2.61*  CALCIUM 8.8* 8.6* 9.0 8.9 8.9  MG  --  1.6*  --   --   --   PHOS  --  3.6  --   --   --    Liver Function Tests: Recent Labs  Lab 12/09/22 1653 12/10/22 1328 12/11/22 0611  AST 24 21 20   ALT 11 12 12   ALKPHOS 126 115 108  BILITOT 0.4 0.3 0.5  PROT 7.1 6.6 6.7  ALBUMIN 3.5 3.3* 3.2*   No results for input(s): "LIPASE", "AMYLASE" in the last 168 hours. No results for input(s): "AMMONIA" in the last 168 hours. CBC: Recent Labs  Lab 12/09/22 1653 12/10/22 1328 12/11/22 0611  WBC 5.9 6.3 6.2  NEUTROABS  --  3.6  --   HGB 7.8* 7.4* 7.4*  HCT 24.9* 24.0* 23.2*  MCV 99.6 100.8* 98.7  PLT 419* 394 396   Cardiac Enzymes: No results for input(s): "CKTOTAL", "CKMB", "CKMBINDEX", "TROPONINI" in the last 168 hours. BNP: Invalid input(s): "POCBNP" CBG: No results for input(s): "GLUCAP" in the last 168 hours. D-Dimer No results for input(s): "DDIMER" in the last 72 hours. Hgb A1c No results for input(s): "HGBA1C" in the last 72 hours. Lipid Profile No results for input(s): "CHOL", "HDL", "LDLCALC", "TRIG", "CHOLHDL", "LDLDIRECT" in the last 72 hours. Thyroid function studies No results for input(s): "TSH", "T4TOTAL", "T3FREE", "THYROIDAB" in the last 72 hours.  Invalid input(s): "FREET3" Anemia work up No results for input(s): "VITAMINB12", "FOLATE", "FERRITIN", "TIBC", "IRON", "RETICCTPCT" in the last 72 hours. Urinalysis    Component Value Date/Time   COLORURINE YELLOW 12/09/2022 2321   APPEARANCEUR CLEAR 12/09/2022 2321   LABSPEC 1.020 12/09/2022 2321   PHURINE 7.0 12/09/2022 2321   GLUCOSEU NEGATIVE 12/09/2022 2321   HGBUR TRACE (A) 12/09/2022 2321   BILIRUBINUR NEGATIVE 12/09/2022 2321   BILIRUBINUR neg  03/21/2011 1510   KETONESUR NEGATIVE 12/09/2022 2321   PROTEINUR >=300 (A) 12/09/2022 2321   UROBILINOGEN 0.2 03/21/2011 1510   NITRITE NEGATIVE 12/09/2022 2321   LEUKOCYTESUR NEGATIVE 12/09/2022 2321   Sepsis Labs Recent Labs  Lab 12/09/22 1653 12/10/22 1328 12/11/22 0611  WBC 5.9 6.3 6.2   Microbiology No results found for this or any previous visit (from the past 240 hour(s)).   Time coordinating discharge:  32 minutes  SIGNED:   Dorcas Carrow, MD  [facility] 12/13/2022, 10:02 AM

## 2022-12-13 NOTE — TOC Transition Note (Signed)
Transition of Care [facility]) - CM/SW Discharge Note   Patient Details  Name: Joanne Shaw MRN: [id number] Date of Birth: 1930/06/17  [facility]) CM/SW Contact:  Amada Jupiter, LCSW Phone Number: 12/13/2022, 1:41 PM   Clinical Narrative:     Pt medically cleared for dc today and returning to ALF apt at [facility].  RN administrator has received FL2 and summary and cleared pt for return.  Facility will provide dc transportation.  Orders placed for PT/OT and services are provided at ALF via [facility]) [facility] - have alerted liaison with [facility].  Have left VM for son.  No further TOC needs.  Final next level of care: Assisted Living (return to [facility]F) Barriers to Discharge: No Barriers Identified   Patient Goals and CMS Choice      Discharge Placement                    Name of family member notified: son    Discharge Plan and Services Additional resources added to the After Visit Summary for   In-house Referral: Clinical Social Work   Post Acute Care Choice: Resumption of Svcs/PTA Provider          DME Arranged: N/A DME Agency: NA       HH Arranged: PT, OT HH Agency: [facility] Date HH Agency Contacted: 12/13/22 Time HH Agency Contacted: 1340 Representative spoke with at [facility] Agency: Equities trader  Social Determinants of Health (SDOH) Interventions SDOH Screenings   Food Insecurity: No Food Insecurity (12/11/2022)  Housing: Low Risk  (12/11/2022)  Recent Concern: Housing - High Risk (12/10/2022)  Transportation Needs: No Transportation Needs (12/11/2022)  Utilities: Not At Risk (12/11/2022)  Financial Resource Strain: Low Risk  (12/23/2021)   Received from [facility], [facility]  Physical Activity: Inactive (12/23/2021)   Received from [facility], [facility]  Social Connections: Moderately Integrated (12/23/2021)   Received from [facility], [facility]  Stress: No Stress Concern Present (12/23/2021)    [facility] from [facility], [facility]  Tobacco Use: Medium Risk (12/09/2022)     Readmission Risk Interventions    12/11/2022    1:50 PM  Readmission Risk Prevention Plan  Transportation Screening Complete  PCP or Specialist Appt within 5-7 Days Complete  Home Care Screening Complete  Medication Review (RN CM) Complete

## 2023-04-03 ENCOUNTER — Encounter (HOSPITAL_COMMUNITY): Payer: Self-pay

## 2023-04-03 ENCOUNTER — Other Ambulatory Visit: Payer: Self-pay

## 2023-04-03 ENCOUNTER — Emergency Department (HOSPITAL_COMMUNITY): Payer: Medicare Other

## 2023-04-03 ENCOUNTER — Emergency Department (HOSPITAL_BASED_OUTPATIENT_CLINIC_OR_DEPARTMENT_OTHER)
Admit: 2023-04-03 | Discharge: 2023-04-03 | Disposition: A | Discharge status: Home / Self Care | Payer: Medicare Other | Attending: Emergency Medicine | Admitting: Emergency Medicine

## 2023-04-03 ENCOUNTER — Other Ambulatory Visit (HOSPITAL_COMMUNITY): Payer: Self-pay

## 2023-04-03 ENCOUNTER — Emergency Department (HOSPITAL_COMMUNITY)
Admission: EM | Admit: 2023-04-03 | Discharge: 2023-04-03 | Disposition: A | Discharge status: Home / Self Care | Payer: Medicare Other | Attending: Emergency Medicine | Admitting: Emergency Medicine

## 2023-04-03 DIAGNOSIS — M79662 Pain in left lower leg: Secondary | ICD-10-CM

## 2023-04-03 DIAGNOSIS — M7989 Other specified soft tissue disorders: Secondary | ICD-10-CM | POA: Diagnosis present

## 2023-04-03 DIAGNOSIS — R600 Localized edema: Secondary | ICD-10-CM | POA: Insufficient documentation

## 2023-04-03 MED ORDER — CEPHALEXIN 500 MG PO CAPS
1000.0000 mg | ORAL_CAPSULE | Freq: Two times a day (BID) | ORAL | 0 refills | Status: DC
  Filled 2023-04-03: qty 28, 7d supply, fill #0

## 2023-04-03 MED ORDER — ACETAMINOPHEN 500 MG PO TABS
1000.0000 mg | ORAL_TABLET | Freq: Once | ORAL | Status: AC
  Administered 2023-04-03: 1000 mg via ORAL
  Filled 2023-04-03: qty 2

## 2023-04-03 MED ORDER — OXYCODONE HCL 5 MG PO TABS
2.5000 mg | ORAL_TABLET | Freq: Once | ORAL | Status: AC
  Administered 2023-04-03: 2.5 mg via ORAL
  Filled 2023-04-03: qty 1

## 2023-04-03 NOTE — ED Notes (Signed)
[facility] called for transport

## 2023-04-03 NOTE — ED Notes (Signed)
Report called to [facility]. Attempt not successful

## 2023-04-03 NOTE — ED Triage Notes (Signed)
Patient brought in by EMS from [facility] for bilateral leg swelling and pain. Pt has leg wound on the left lower leg. EMS noted indention in bilateral legs from where a possible sock may have been. Pt has dementia at baseline and unable to state if she has had any recent falls. Bruising noted from the left lower leg into the left foot. Bruising noted all over arms as well.

## 2023-04-03 NOTE — Discharge Instructions (Signed)
Your test here look okay.  There is no obvious broken bone.  It could be a soft tissue injury.  The other possibility is that you have a skin infection.  I think that is less likely but I did start you on a oral antibiotic.  Please follow-up with your family doctor in the office.

## 2023-04-03 NOTE — ED Provider Notes (Signed)
[place] EMERGENCY DEPARTMENT AT [facility] Provider Note   CSN: [id number] Arrival date & time: 04/03/23  1057     History  Chief Complaint  Patient presents with   Leg Swelling    Joanne Shaw is a 89 y.o. female.  89 yo F with a chief complaints of left leg pain.  This is reported by EMS.  States that they were called out for pain.  Denied the possibility of a fall.  Reportedly there is some indention just below the knee bilaterally which seems to have improved en route.  They took her socks off as they got here and notes that she had some swelling and bruising to mostly the lateral aspect of the left lower leg.        Home Medications Prior to Admission medications   Medication Sig Start Date End Date Taking? Authorizing Provider  acetaminophen (TYLENOL) 325 MG tablet Take 650 mg by mouth in the morning, at noon, and at bedtime.   Yes [provider]  cephALEXin (KEFLEX) 500 MG capsule 2 capsule by mouth 2 times daily for 7 days 04/03/23  Yes Melene Plan, DO  ferrous sulfate 325 (65 FE) MG EC tablet Take 325 mg by mouth every other day.   Yes [provider]  furosemide (LASIX) 20 MG tablet Take 20 mg by mouth daily. For 5 days then stop (starting 03/29/23)   Yes [provider]  hydrALAZINE (APRESOLINE) 25 MG tablet Take 25 mg by mouth 3 (three) times daily.   Yes [provider]  hydrochlorothiazide (HYDRODIURIL) 25 MG tablet Take 25 mg by mouth daily. For 7 days starting 04/03/23.   Yes [provider]  levothyroxine (SYNTHROID) 88 MCG tablet Take 88 mcg by mouth daily before breakfast.   Yes [provider]  metoprolol succinate (TOPROL-XL) 50 MG 24 hr tablet TAKE 1 TABLET BY MOUTH EVERY DAY 08/24/12  Yes Agapito Games, MD  pantoprazole (PROTONIX) 40 MG tablet Take 40 mg by mouth every evening.   Yes [provider]  polyethylene glycol (MIRALAX / GLYCOLAX) 17 g packet Take 17 g by mouth  daily.   Yes [provider]  senna (SENOKOT) 8.6 MG TABS tablet Take 17.2 mg by mouth daily as needed for mild constipation or moderate constipation.   Yes [provider]  sertraline (ZOLOFT) 50 MG tablet Take 50 mg by mouth daily.   Yes [provider]  methotrexate (RHEUMATREX) 2.5 MG tablet Take 10 mg by mouth once a week. Fridays Patient not taking: Reported on 04/03/2023    [provider]  mirtazapine (REMERON) 30 MG tablet Take 30 mg by mouth at bedtime. Patient not taking: Reported on 04/03/2023    [provider]      Allergies    Amlodipine, Codeine sulfate, Demerol [meperidine], Lisinopril, Oxycodone, and Penicillins    Review of Systems   Review of Systems  Physical Exam Updated Vital Signs BP (!) 188/68 (BP Location: Right Arm)   Pulse 64   Temp 97.6 F (36.4 C) (Oral)   Resp 18   Ht 5\' 3"  (1.6 m)   Wt 63.5 kg   SpO2 97%   BMI 24.80 kg/m  Physical Exam Vitals and nursing note reviewed.  Constitutional:      General: She is not in acute distress.    Appearance: She is well-developed. She is not diaphoretic.  HENT:     Head: Normocephalic and atraumatic.  Eyes:  Pupils: Pupils are equal, round, and reactive to light.  Cardiovascular:     Rate and Rhythm: Normal rate and regular rhythm.     Heart sounds: No murmur heard.    No friction rub. No gallop.  Pulmonary:     Effort: Pulmonary effort is normal.     Breath sounds: No wheezing or rales.  Abdominal:     General: There is no distension.     Palpations: Abdomen is soft.     Tenderness: There is no abdominal tenderness.  Musculoskeletal:        General: Swelling and tenderness present.     Cervical back: Normal range of motion and neck supple.     Comments: I am unable to range either hip.  She has erythema and edema to bilateral lower extremities worse on the left than the right.  Does appear to have some discomfort there.  Skin:    General: Skin is warm  and dry.  Neurological:     Mental Status: She is alert.  Psychiatric:        Behavior: Behavior normal.     ED Results / Procedures / Treatments   Labs (all labs ordered are listed, but only abnormal results are displayed) Labs Reviewed - No data to display  EKG None  Radiology VAS [place] LOWER EXTREMITY VENOUS (DVT) Result Date: 04/03/2023  Lower Venous DVT Study Patient Name:  Joanne Shaw  Date of Exam:   04/03/2023 Medical Rec #: [id number]       Accession #:    [id number] Date of Birth: Mar 08, 1930       Patient Gender: F Patient Age:   5 years Exam Location:  [facility] Procedure:      VAS [place] LOWER EXTREMITY VENOUS (DVT) Referring Phys: Harkirat Orozco --------------------------------------------------------------------------------  Indications: Pain.  Risk Factors: None identified. Limitations: Poor ultrasound/tissue interface and patient positioning. Comparison Study: No prior studies. Performing Technologist: Chanda Busing RVT  Examination Guidelines: A complete evaluation includes B-mode imaging, spectral Doppler, color Doppler, and power Doppler as needed of all accessible portions of each vessel. Bilateral testing is considered an integral part of a complete examination. Limited examinations for reoccurring indications may be performed as noted. The reflux portion of the exam is performed with the patient in reverse Trendelenburg.  +-----+---------------+---------+-----------+----------+--------------+ RIGHTCompressibilityPhasicitySpontaneityPropertiesThrombus Aging +-----+---------------+---------+-----------+----------+--------------+ CFV  Full           Yes      No                                  +-----+---------------+---------+-----------+----------+--------------+   +---------+---------------+---------+-----------+----------+--------------+ LEFT     CompressibilityPhasicitySpontaneityPropertiesThrombus Aging  +---------+---------------+---------+-----------+----------+--------------+ CFV      Full           Yes      No                                  +---------+---------------+---------+-----------+----------+--------------+ SFJ      Full                                                        +---------+---------------+---------+-----------+----------+--------------+ FV Prox  Full                                                        +---------+---------------+---------+-----------+----------+--------------+  FV Mid   Full                                                        +---------+---------------+---------+-----------+----------+--------------+ FV DistalFull                                                        +---------+---------------+---------+-----------+----------+--------------+ PFV      Full                                                        +---------+---------------+---------+-----------+----------+--------------+ POP      Full           Yes      No                                  +---------+---------------+---------+-----------+----------+--------------+ PTV      Full                                                        +---------+---------------+---------+-----------+----------+--------------+ PERO     Full                                                        +---------+---------------+---------+-----------+----------+--------------+    Summary: RIGHT: - No evidence of common femoral vein obstruction.   LEFT: - There is no evidence of deep vein thrombosis in the lower extremity.  - No cystic structure found in the popliteal fossa.  *See table(s) above for measurements and observations.    Preliminary    DG Tibia/Fibula Left Result Date: 04/03/2023 CLINICAL DATA:  Bilateral leg swelling and pain. Wound on the left lower leg. History of dementia. EXAM: LEFT TIBIA AND FIBULA - 2 VIEW COMPARISON:  None Available. FINDINGS: Diffuse  osseous demineralization. No acute fracture or dislocation. No evidence of osteolysis. Mild-to-moderate degenerative changes of the knee with chondrocalcinosis in the medial and lateral femorotibial compartments. Calcaneal enthesopathy at the insertion of the Achilles tendon. Focal soft tissue prominence at the anterior mid shin, could correspond to patient's reported wound. No soft tissue gas. No radiopaque foreign body. IMPRESSION: 1. No acute osseous abnormality. 2. Focal soft tissue prominence at the anterior mid shin, could correspond to patient's reported wound. No soft tissue gas or radiopaque foreign body. 3. Mild-to-moderate degenerative changes of the knee with chondrocalcinosis. Electronically Signed   By: Hart Robinsons M.D.   On: 04/03/2023 12:59    Procedures Procedures    Medications Ordered in ED Medications  acetaminophen (TYLENOL) tablet 1,000 mg (1,000 mg Oral Given 04/03/23 1134)  oxyCODONE (Oxy IR/ROXICODONE) immediate release tablet  2.5 mg (2.5 mg Oral Given 04/03/23 1134)    ED Course/ Medical Decision Making/ A&P                                 Medical Decision Making Amount and/or Complexity of Data Reviewed Radiology: ordered.  Risk OTC drugs. Prescription drug management.   89 yo F with a chief complaints of left lower leg pain and swelling.  Noticed this morning by nursing staff.  Patient is demented and unable to provide any history.  Almost all the history is obtained by EMS.  She does have some pain and swelling there.  Will obtain a plain film.  She has a small break in the skin overlying.  Not exceptionally warm no apparent fluctuance.  Xray without obvious fx on my independent interpretation.  [place] without DVT.   I will treat for possible cellulitis though I think this is less likely.  I think more likely the patient has a contusion.  No signs of compartment syndrome.  Will discharge back to her facility.  1:54 PM:  I have discussed the  diagnosis/risks/treatment options with the patient.  Evaluation and diagnostic testing in the emergency department does not suggest an emergent condition requiring admission or immediate intervention beyond what has been performed at this time.  They will follow up with PCP. We also discussed returning to the ED immediately if new or worsening sx occur. We discussed the sx which are most concerning (e.g., sudden worsening pain, fever, inability to tolerate by mouth) that necessitate immediate return. Medications administered to the patient during their visit and any new prescriptions provided to the patient are listed below.  Medications given during this visit Medications  acetaminophen (TYLENOL) tablet 1,000 mg (1,000 mg Oral Given 04/03/23 1134)  oxyCODONE (Oxy IR/ROXICODONE) immediate release tablet 2.5 mg (2.5 mg Oral Given 04/03/23 1134)     The patient appears reasonably screen and/or stabilized for discharge and I doubt any other medical condition or other [facility] requiring further screening, evaluation, or treatment in the ED at this time prior to discharge.          Final Clinical Impression(s) / ED Diagnoses Final diagnoses:  Peripheral edema    Rx / DC Orders ED Discharge Orders          Ordered    cephALEXin (KEFLEX) 500 MG capsule        04/03/23 1325              Melene Plan, DO 04/03/23 1354

## 2023-04-03 NOTE — Progress Notes (Signed)
Left lower extremity venous duplex has been completed. Preliminary results can be found in CV Proc through chart review.  Results were given to Dr. Adela Lank.  04/03/23 1:16 PM Olen Cordial RVT

## 2023-04-03 NOTE — ED Notes (Signed)
Report called and spoke with Joni Reining.

## 2023-04-04 ENCOUNTER — Other Ambulatory Visit (HOSPITAL_COMMUNITY): Payer: Self-pay

## 2023-04-18 ENCOUNTER — Inpatient Hospital Stay (HOSPITAL_COMMUNITY)
Admission: EM | Admit: 2023-04-18 | Discharge: 2023-04-22 | DRG: 480 | Disposition: A | Discharge status: Hospice | Payer: Medicare Other | Source: Skilled Nursing Facility | Attending: Family Medicine | Admitting: Family Medicine

## 2023-04-18 ENCOUNTER — Emergency Department (HOSPITAL_COMMUNITY): Payer: Medicare Other

## 2023-04-18 ENCOUNTER — Encounter (HOSPITAL_COMMUNITY): Payer: Self-pay | Admitting: Emergency Medicine

## 2023-04-18 ENCOUNTER — Other Ambulatory Visit: Payer: Self-pay

## 2023-04-18 DIAGNOSIS — Y92234 Operating room of hospital as the place of occurrence of the external cause: Secondary | ICD-10-CM | POA: Diagnosis not present

## 2023-04-18 DIAGNOSIS — G301 Alzheimer's disease with late onset: Secondary | ICD-10-CM

## 2023-04-18 DIAGNOSIS — M1711 Unilateral primary osteoarthritis, right knee: Secondary | ICD-10-CM | POA: Diagnosis present

## 2023-04-18 DIAGNOSIS — F0284 Dementia in other diseases classified elsewhere, unspecified severity, with anxiety: Secondary | ICD-10-CM | POA: Diagnosis not present

## 2023-04-18 DIAGNOSIS — Z885 Allergy status to narcotic agent status: Secondary | ICD-10-CM | POA: Diagnosis not present

## 2023-04-18 DIAGNOSIS — Z515 Encounter for palliative care: Secondary | ICD-10-CM

## 2023-04-18 DIAGNOSIS — E43 Unspecified severe protein-calorie malnutrition: Secondary | ICD-10-CM | POA: Diagnosis present

## 2023-04-18 DIAGNOSIS — X58XXXA Exposure to other specified factors, initial encounter: Secondary | ICD-10-CM | POA: Diagnosis present

## 2023-04-18 DIAGNOSIS — F02C4 Dementia in other diseases classified elsewhere, severe, with anxiety: Secondary | ICD-10-CM | POA: Diagnosis present

## 2023-04-18 DIAGNOSIS — Y92098 Other place in other non-institutional residence as the place of occurrence of the external cause: Secondary | ICD-10-CM | POA: Diagnosis not present

## 2023-04-18 DIAGNOSIS — Z87891 Personal history of nicotine dependence: Secondary | ICD-10-CM | POA: Diagnosis not present

## 2023-04-18 DIAGNOSIS — S72001A Fracture of unspecified part of neck of right femur, initial encounter for closed fracture: Secondary | ICD-10-CM | POA: Diagnosis not present

## 2023-04-18 DIAGNOSIS — S72391A Other fracture of shaft of right femur, initial encounter for closed fracture: Principal | ICD-10-CM | POA: Diagnosis present

## 2023-04-18 DIAGNOSIS — S7291XA Unspecified fracture of right femur, initial encounter for closed fracture: Principal | ICD-10-CM

## 2023-04-18 DIAGNOSIS — D649 Anemia, unspecified: Secondary | ICD-10-CM | POA: Diagnosis not present

## 2023-04-18 DIAGNOSIS — E876 Hypokalemia: Secondary | ICD-10-CM | POA: Diagnosis not present

## 2023-04-18 DIAGNOSIS — I129 Hypertensive chronic kidney disease with stage 1 through stage 4 chronic kidney disease, or unspecified chronic kidney disease: Secondary | ICD-10-CM | POA: Diagnosis present

## 2023-04-18 DIAGNOSIS — S72351A Displaced comminuted fracture of shaft of right femur, initial encounter for closed fracture: Secondary | ICD-10-CM | POA: Diagnosis not present

## 2023-04-18 DIAGNOSIS — Z6821 Body mass index (BMI) 21.0-21.9, adult: Secondary | ICD-10-CM

## 2023-04-18 DIAGNOSIS — S51012A Laceration without foreign body of left elbow, initial encounter: Secondary | ICD-10-CM | POA: Diagnosis not present

## 2023-04-18 DIAGNOSIS — C50919 Malignant neoplasm of unspecified site of unspecified female breast: Secondary | ICD-10-CM | POA: Diagnosis present

## 2023-04-18 DIAGNOSIS — N179 Acute kidney failure, unspecified: Secondary | ICD-10-CM | POA: Diagnosis present

## 2023-04-18 DIAGNOSIS — D539 Nutritional anemia, unspecified: Secondary | ICD-10-CM | POA: Diagnosis present

## 2023-04-18 DIAGNOSIS — R296 Repeated falls: Secondary | ICD-10-CM | POA: Diagnosis not present

## 2023-04-18 DIAGNOSIS — Z7401 Bed confinement status: Secondary | ICD-10-CM

## 2023-04-18 DIAGNOSIS — Z880 Allergy status to penicillin: Secondary | ICD-10-CM | POA: Diagnosis not present

## 2023-04-18 DIAGNOSIS — Z789 Other specified health status: Secondary | ICD-10-CM | POA: Diagnosis not present

## 2023-04-18 DIAGNOSIS — Z888 Allergy status to other drugs, medicaments and biological substances status: Secondary | ICD-10-CM

## 2023-04-18 DIAGNOSIS — G309 Alzheimer's disease, unspecified: Secondary | ICD-10-CM | POA: Diagnosis present

## 2023-04-18 DIAGNOSIS — E872 Acidosis, unspecified: Secondary | ICD-10-CM | POA: Diagnosis present

## 2023-04-18 DIAGNOSIS — S72301A Unspecified fracture of shaft of right femur, initial encounter for closed fracture: Secondary | ICD-10-CM | POA: Diagnosis not present

## 2023-04-18 DIAGNOSIS — Z7189 Other specified counseling: Secondary | ICD-10-CM | POA: Diagnosis not present

## 2023-04-18 DIAGNOSIS — Z66 Do not resuscitate: Secondary | ICD-10-CM | POA: Diagnosis present

## 2023-04-18 DIAGNOSIS — N1832 Chronic kidney disease, stage 3b: Secondary | ICD-10-CM | POA: Diagnosis present

## 2023-04-18 DIAGNOSIS — K219 Gastro-esophageal reflux disease without esophagitis: Secondary | ICD-10-CM | POA: Diagnosis present

## 2023-04-18 DIAGNOSIS — E785 Hyperlipidemia, unspecified: Secondary | ICD-10-CM | POA: Diagnosis not present

## 2023-04-18 DIAGNOSIS — R52 Pain, unspecified: Secondary | ICD-10-CM | POA: Diagnosis not present

## 2023-04-18 DIAGNOSIS — Z853 Personal history of malignant neoplasm of breast: Secondary | ICD-10-CM

## 2023-04-18 DIAGNOSIS — E039 Hypothyroidism, unspecified: Secondary | ICD-10-CM | POA: Diagnosis not present

## 2023-04-18 DIAGNOSIS — C50911 Malignant neoplasm of unspecified site of right female breast: Secondary | ICD-10-CM | POA: Diagnosis not present

## 2023-04-18 DIAGNOSIS — D62 Acute posthemorrhagic anemia: Secondary | ICD-10-CM | POA: Diagnosis present

## 2023-04-18 DIAGNOSIS — M9701XA Periprosthetic fracture around internal prosthetic right hip joint, initial encounter: Secondary | ICD-10-CM | POA: Diagnosis present

## 2023-04-18 DIAGNOSIS — I10 Essential (primary) hypertension: Secondary | ICD-10-CM | POA: Diagnosis not present

## 2023-04-18 DIAGNOSIS — Z79899 Other long term (current) drug therapy: Secondary | ICD-10-CM | POA: Diagnosis not present

## 2023-04-18 DIAGNOSIS — Z79890 Hormone replacement therapy (postmenopausal): Secondary | ICD-10-CM

## 2023-04-18 LAB — BASIC METABOLIC PANEL
Anion gap: 13 (ref 5–15)
BUN: 77 mg/dL — ABNORMAL HIGH (ref 8–23)
CO2: 17 mmol/L — ABNORMAL LOW (ref 22–32)
Calcium: 8.2 mg/dL — ABNORMAL LOW (ref 8.9–10.3)
Chloride: 103 mmol/L (ref 98–111)
Creatinine, Ser: 5.7 mg/dL — ABNORMAL HIGH (ref 0.44–1.00)
GFR, Estimated: 7 mL/min — ABNORMAL LOW (ref >60)
Glucose, Bld: 115 mg/dL — ABNORMAL HIGH (ref 70–99)
Potassium: 2.9 mmol/L — ABNORMAL LOW (ref 3.5–5.1)
Sodium: 133 mmol/L — ABNORMAL LOW (ref 135–145)

## 2023-04-18 LAB — CBC WITH DIFFERENTIAL/PLATELET
Abs Immature Granulocytes: 0.1 10*3/uL — ABNORMAL HIGH (ref 0.00–0.07)
Basophils Absolute: 0 10*3/uL (ref 0.0–0.1)
Basophils Relative: 0 %
Eosinophils Absolute: 0 10*3/uL (ref 0.0–0.5)
Eosinophils Relative: 0 %
HCT: 25.2 % — ABNORMAL LOW (ref 36.0–46.0)
Hemoglobin: 7.6 g/dL — ABNORMAL LOW (ref 12.0–15.0)
Immature Granulocytes: 1 %
Lymphocytes Relative: 16 %
Lymphs Abs: 1.7 10*3/uL (ref 0.7–4.0)
MCH: 31.1 pg (ref 26.0–34.0)
MCHC: 30.2 g/dL (ref 30.0–36.0)
MCV: 103.3 fL — ABNORMAL HIGH (ref 80.0–100.0)
Monocytes Absolute: 0.5 10*3/uL (ref 0.1–1.0)
Monocytes Relative: 4 %
Neutro Abs: 8.2 10*3/uL — ABNORMAL HIGH (ref 1.7–7.7)
Neutrophils Relative %: 79 %
Platelets: 335 10*3/uL (ref 150–400)
RBC: 2.44 MIL/uL — ABNORMAL LOW (ref 3.87–5.11)
RDW: 16.4 % — ABNORMAL HIGH (ref 11.5–15.5)
WBC: 10.5 10*3/uL (ref 4.0–10.5)
nRBC: 0 % (ref 0.0–0.2)

## 2023-04-18 LAB — PROTIME-INR
INR: 1.1 (ref 0.8–1.2)
Prothrombin Time: 14 s (ref 11.4–15.2)

## 2023-04-18 MED ORDER — FENTANYL CITRATE PF 50 MCG/ML IJ SOSY
50.0000 ug | PREFILLED_SYRINGE | INTRAMUSCULAR | Status: AC | PRN
  Administered 2023-04-18 – 2023-04-19 (×2): 50 ug via INTRAVENOUS
  Filled 2023-04-18 (×2): qty 1

## 2023-04-18 NOTE — H&P (Signed)
 History and Physical    Patient: Joanne Shaw ZOX:[id number] DOB: 25-Apr-1930 DOA: 04/18/2023 DOS: the patient was seen and examined on 04/18/2023 PCP: Pcp, No  Patient coming from: Home  Chief Complaint:  Chief Complaint  Patient presents with   Hip Pain   HPI: Joanne Shaw is a 89 y.o. female with medical history significant of right breast cancer, hyperlipidemia, essential hypertension, hypothyroidism, hospital neurology, senile dementia who apparently is on hospice care but was brought in with fall sustaining displaced right midshaft femur subtrochanteric fracture below a previously fix femoral neck fracture with severe collapse of the femoral head.  Patient wants this fixed patient to be transferred to [facility] per orthopedics with plan for surgery in the morning.  Review of Systems: As mentioned in the history of present illness. All other systems reviewed and are negative. Past Medical History:  Diagnosis Date   Cancer (HCC) 01/02/2010   right breast (Dr Kathaleen Grinder)   Chronic arthritis    Hyperlipidemia    Hypertension    Hypothyroidism    Occipital neuralgia    30 yrs ago   Osteopenia    Right knee DJD    Senile dementia [facility])    Past Surgical History:  Procedure Laterality Date   APPENDECTOMY     BREAST SURGERY  2-12   right breast segmental mastectomy- Dr Lafayette Dragon   TUBAL LIGATION     Social History:  reports that she quit smoking about 25 years ago. Her smoking use included cigarettes. She does not have any smokeless tobacco history on file. She reports current alcohol use of about 7.0 standard drinks of alcohol per week. She reports that she does not use drugs.  Allergies  Allergen Reactions   Amlodipine Other (See Comments)    Reaction not listed on MAR    Codeine Sulfate Nausea And Vomiting   Demerol [Meperidine] Nausea Only   Lisinopril Other (See Comments)    Reaction not listed on MAR    Oxycodone Other (See Comments)    Reaction not listed on MAR     Penicillins Swelling    Allergy not listed on MAR     History reviewed. No pertinent family history.  Prior to Admission medications   Medication Sig Start Date End Date Taking? Authorizing Provider  acetaminophen (TYLENOL) 325 MG tablet Take 650 mg by mouth in the morning, at noon, and at bedtime.    [provider]  cephALEXin (KEFLEX) 500 MG capsule Take 2 capsules (1,000 mg total) by mouth 2 (two) times daily for 7 days. 04/03/23   Melene Plan, DO  ferrous sulfate 325 (65 FE) MG EC tablet Take 325 mg by mouth every other day.    [provider]  furosemide (LASIX) 20 MG tablet Take 20 mg by mouth daily. For 5 days then stop (starting 03/29/23)    [provider]  hydrALAZINE (APRESOLINE) 25 MG tablet Take 25 mg by mouth 3 (three) times daily.    [provider]  hydrochlorothiazide (HYDRODIURIL) 25 MG tablet Take 25 mg by mouth daily. For 7 days starting 04/03/23.    [provider]  levothyroxine (SYNTHROID) 88 MCG tablet Take 88 mcg by mouth daily before breakfast.    [provider]  methotrexate (RHEUMATREX) 2.5 MG tablet Take 10 mg by mouth once a week. Fridays Patient not taking: Reported on 04/03/2023    [provider]  metoprolol succinate (TOPROL-XL) 50 MG 24 hr tablet TAKE 1 TABLET BY MOUTH EVERY DAY 08/24/12  Agapito Games, MD  mirtazapine (REMERON) 30 MG tablet Take 30 mg by mouth at bedtime. Patient not taking: Reported on 04/03/2023    [provider]  pantoprazole (PROTONIX) 40 MG tablet Take 40 mg by mouth every evening.    [provider]  polyethylene glycol (MIRALAX / GLYCOLAX) 17 g packet Take 17 g by mouth daily.    [provider]  senna (SENOKOT) 8.6 MG TABS tablet Take 17.2 mg by mouth daily as needed for mild constipation or moderate constipation.    [provider]  sertraline (ZOLOFT) 50 MG tablet Take 50 mg by mouth daily.    [provider]     Physical Exam: Vitals:   04/18/23 1947 04/18/23 1948 04/18/23 1949 04/18/23 1950  BP:  (!) 172/76    Pulse:  62    Resp:  16    Temp:  (!) 97.4 F (36.3 C)    TempSrc:  Oral    SpO2: 96% 97%    Weight:   54.8 kg 54.8 kg  Height:    5\' 3"  (1.6 m)   Constitutional: Chronically ill looking, emaciated, no obvious distress NAD, calm, comfortable Eyes: PERRL, lids and conjunctivae normal ENMT: Mucous membranes are moist. Posterior pharynx clear of any exudate or lesions.Normal dentition.  Neck: normal, supple, no masses, no thyromegaly Respiratory: clear to auscultation bilaterally, no wheezing, no crackles. Normal respiratory effort. No accessory muscle use.  Cardiovascular: Regular rate and rhythm, no murmurs / rubs / gallops. No extremity edema. 2+ pedal pulses. No carotid bruits.  Abdomen: no tenderness, no masses palpated. No hepatosplenomegaly. Bowel sounds positive.  Musculoskeletal: Shortened, angulated right lower extremity Skin: no rashes, lesions, ulcers. No induration Neurologic: CN 2-12 grossly intact. Sensation intact, DTR normal. Strength 5/5 in all 4.  Psychiatric: Normal judgment and insight. Alert and oriented x 3. Normal mood  Data Reviewed:  Temperature 97.4, blood pressure 175/69, pulse 62 respiratory rate of 16, hemoglobin 7.6 platelet 335.  Sodium is 133 potassium 2.9 chloride 103 CO2 17 BUN 17 creatinine 5.70 calcium 8.2 glucose 115 chest x-ray showed no acute findings x-ray of the right femur showed displaced proximal femur shaft fracture.   Assessment and Plan:  #1 right femoral shaft fracture: Patient wants to have these evaluated also treated.  Patient will continue with pain management.  N.p.o. after midnight  #2 acute on chronic kidney disease stage IIIb: Probably prerenal.  Hydrate aggressively.  Follow renal function closely.  #3 severe protein calorie malnutrition: Continue diet.  Patient already on hospice care.  #4 hypokalemia: Replete  potassium.  #5 Alzheimer's dementia: No agitation  #6 hypomagnesemia: Continue to replete  #7 frequent falls: PT and OT.  #8 hypothyroidism: Continue levothyroxine    Advance Care Planning:   Code Status: Full Code DNR  Consults: Dr. Victorino Dike, orthopedic surgery  Family Communication: No family at bedside  Severity of Illness: The appropriate patient status for this patient is INPATIENT. Inpatient status is judged to be reasonable and necessary in order to provide the required intensity of service to ensure the patient's safety. The patient's presenting symptoms, physical exam findings, and initial radiographic and laboratory data in the context of their chronic comorbidities is felt to place them at high risk for further clinical deterioration. Furthermore, it is not anticipated that the patient will be medically stable for discharge from the hospital within 2 midnights of admission.   * I certify that at the point of admission it is my clinical judgment that  the patient will require inpatient hospital care spanning beyond 2 midnights from the point of admission due to high intensity of service, high risk for further deterioration and high frequency of surveillance required.*  AuthorLonia Blood, MD 04/18/2023 10:14 PM  For on call review [email or web address].

## 2023-04-18 NOTE — ED Notes (Signed)
Unable to place knee immobilizer to R leg per this RN and Ortho staff due to restricted extremity positioning and extreme pain with attempting to straighten extremity. MD made aware and okay with not placing knee immobilizer at this time.

## 2023-04-18 NOTE — ED Provider Notes (Signed)
[place] EMERGENCY DEPARTMENT AT [facility] Provider Note   CSN: [id number] Arrival date & time: 04/18/23  1937     History  Chief Complaint  Patient presents with   Hip Pain    Joanne Shaw is a 89 y.o. female.   Hip Pain  Patient with right thigh/hip pain.  Began while staff was changing patient.  Reportedly is a hospice patient.  Deformity.  No other apparent injury.    Past Medical History:  Diagnosis Date   Cancer (HCC) 01/02/2010   right breast (Dr Kathaleen Grinder)   Chronic arthritis    Hyperlipidemia    Hypertension    Hypothyroidism    Occipital neuralgia    30 yrs ago   Osteopenia    Right knee DJD    Senile dementia [facility])    Past Surgical History:  Procedure Laterality Date   APPENDECTOMY     BREAST SURGERY  2-12   right breast segmental mastectomy- Dr Lafayette Dragon   TUBAL LIGATION       Home Medications Prior to Admission medications   Medication Sig Start Date End Date Taking? Authorizing Provider  acetaminophen (TYLENOL) 325 MG tablet Take 650 mg by mouth in the morning, at noon, and at bedtime.    [provider]  cephALEXin (KEFLEX) 500 MG capsule Take 2 capsules (1,000 mg total) by mouth 2 (two) times daily for 7 days. 04/03/23   Melene Plan, DO  ferrous sulfate 325 (65 FE) MG EC tablet Take 325 mg by mouth every other day.    [provider]  furosemide (LASIX) 20 MG tablet Take 20 mg by mouth daily. For 5 days then stop (starting 03/29/23)    [provider]  hydrALAZINE (APRESOLINE) 25 MG tablet Take 25 mg by mouth 3 (three) times daily.    [provider]  hydrochlorothiazide (HYDRODIURIL) 25 MG tablet Take 25 mg by mouth daily. For 7 days starting 04/03/23.    [provider]  levothyroxine (SYNTHROID) 88 MCG tablet Take 88 mcg by mouth daily before breakfast.    [provider]  methotrexate (RHEUMATREX) 2.5 MG tablet Take 10 mg by mouth once a week. Fridays Patient not taking:  Reported on 04/03/2023    [provider]  metoprolol succinate (TOPROL-XL) 50 MG 24 hr tablet TAKE 1 TABLET BY MOUTH EVERY DAY 08/24/12   Agapito Games, MD  mirtazapine (REMERON) 30 MG tablet Take 30 mg by mouth at bedtime. Patient not taking: Reported on 04/03/2023    [provider]  pantoprazole (PROTONIX) 40 MG tablet Take 40 mg by mouth every evening.    [provider]  polyethylene glycol (MIRALAX / GLYCOLAX) 17 g packet Take 17 g by mouth daily.    [provider]  senna (SENOKOT) 8.6 MG TABS tablet Take 17.2 mg by mouth daily as needed for mild constipation or moderate constipation.    [provider]  sertraline (ZOLOFT) 50 MG tablet Take 50 mg by mouth daily.    [provider]      Allergies    Amlodipine, Codeine sulfate, Demerol [meperidine], Lisinopril, Oxycodone, and Penicillins    Review of Systems   Review of Systems  Physical Exam Updated Vital Signs BP (!) 172/76 (BP Location: Right Arm)   Pulse 62   Temp (!) 97.4 F (36.3 C) (Oral)   Resp 16   Ht 5\' 3"  (1.6 m)   Wt 54.8 kg   SpO2 97%   BMI 21.40 kg/m  Physical Exam Vitals and nursing note reviewed.  HENT:     Head: Atraumatic.  Cardiovascular:     Rate and Rhythm: Regular rhythm.  Abdominal:     Tenderness: There is no abdominal tenderness.  Musculoskeletal:        General: Tenderness present.     Comments: Deformity to right mid femur.  Pulse grossly intact in foot.  Skin intact.  Neurological:     Mental Status: She is alert.     ED Results / Procedures / Treatments   Labs (all labs ordered are listed, but only abnormal results are displayed) Labs Reviewed  BASIC METABOLIC PANEL - Abnormal; Notable for the following components:      Result Value   Sodium 133 (*)    Potassium 2.9 (*)    CO2 17 (*)    Glucose, Bld 115 (*)    BUN 77 (*)    Creatinine, Ser 5.70 (*)    Calcium 8.2 (*)    GFR, Estimated 7 (*)    All other components  within normal limits  CBC WITH DIFFERENTIAL/PLATELET - Abnormal; Notable for the following components:   RBC 2.44 (*)    Hemoglobin 7.6 (*)    HCT 25.2 (*)    MCV 103.3 (*)    RDW 16.4 (*)    Neutro Abs 8.2 (*)    Abs Immature Granulocytes 0.10 (*)    All other components within normal limits  PROTIME-INR  TYPE AND SCREEN    EKG None  Radiology DG Chest Portable 1 View Result Date: 04/18/2023 CLINICAL DATA:  Right femur fracture. EXAM: PORTABLE CHEST 1 VIEW COMPARISON:  Chest radiograph 12/09/2022 FINDINGS: Chronic cardiomegaly. Unchanged mediastinal contours. Aortic atherosclerosis. Chronic interstitial coarsening without focal airspace disease. No pleural fluid or pneumothorax. No acute osseous findings. Surgical clips project over the right hemithorax. IMPRESSION: 1. No acute findings. 2. Chronic interstitial coarsening. Electronically Signed   By: Narda Rutherford M.D.   On: 04/18/2023 21:30   DG Femur Portable Min 2 Views Right Result Date: 04/18/2023 CLINICAL DATA:  Deformity.  Right hip pain. EXAM: RIGHT FEMUR PORTABLE 2 VIEW COMPARISON:  None Available. FINDINGS: The bones are diffusely under mineralized. There is displaced fracture of the proximal femoral shaft. Osseous overriding of approximately 4 cm. Previous pinning of the right femoral neck. Advanced right hip arthropathy. The distal femur is intact. Knee alignment is maintained. IMPRESSION: Displaced proximal femoral shaft fracture with osseous overriding. Electronically Signed   By: Narda Rutherford M.D.   On: 04/18/2023 21:28    Procedures Procedures    Medications Ordered in ED Medications  fentaNYL (SUBLIMAZE) injection 50 mcg (50 mcg Intravenous Given 04/18/23 2010)    ED Course/ Medical Decision Making/ A&P                                 Medical Decision Making Amount and/or Complexity of Data Reviewed Labs: ordered. Radiology: ordered.  Risk Prescription drug management.   Patient with right thigh  injury.  Found to have femur fracture.  Discussed with Dr. Victorino Dike from orthopedic surgery.  Requested transfer to [facility].  Would benefit from Buck's traction but will not be able to be transported with it.  Potentially knee immobilizer could help.  Although patient was unable to tolerate it.  No other  Injury.  Hemoglobin stable at 7 however creatinine is gone from 3 up to 5.  Patient reportedly is  also in hospice but they do not have specific paperwork for it.  Will discuss with hospitalist for admission.        Final Clinical Impression(s) / ED Diagnoses Final diagnoses:  Closed fracture of right femur, unspecified fracture morphology, unspecified portion of femur, initial encounter (HCC)  AKI (acute kidney injury) (HCC)  Anemia, unspecified type    Rx / DC Orders ED Discharge Orders     None         Benjiman Core, MD 04/18/23 2140

## 2023-04-18 NOTE — Progress Notes (Addendum)
Ortho  89 y/o female under hospice care sustained a displaced right femur oblique subtrochanteric fracture below a previously fixed femoral neck fracture with severe collapse of the femoral head.  I recommended admission to hospitalist service at [facility].  I'll post her for OR tomorrow.  NPO after midnight.  Hold blood thinners.  Recommend Bucks traction.  Full consult note to follow in AM.

## 2023-04-18 NOTE — ED Triage Notes (Signed)
Pt arrives via EMS from [facility] with R hip pain after staff were changing pt and "felt a pop." EMS report pt is a hospice pt. Deformity noted. 50 mcg Fentanyl given by EMS en route.

## 2023-04-19 ENCOUNTER — Encounter (HOSPITAL_COMMUNITY)
Admission: EM | Disposition: A | Discharge status: Hospice | Payer: Self-pay | Source: Skilled Nursing Facility | Attending: Family Medicine

## 2023-04-19 DIAGNOSIS — Z515 Encounter for palliative care: Secondary | ICD-10-CM | POA: Diagnosis not present

## 2023-04-19 DIAGNOSIS — E43 Unspecified severe protein-calorie malnutrition: Secondary | ICD-10-CM

## 2023-04-19 DIAGNOSIS — N1832 Chronic kidney disease, stage 3b: Secondary | ICD-10-CM

## 2023-04-19 DIAGNOSIS — C50911 Malignant neoplasm of unspecified site of right female breast: Secondary | ICD-10-CM

## 2023-04-19 DIAGNOSIS — S72301A Unspecified fracture of shaft of right femur, initial encounter for closed fracture: Secondary | ICD-10-CM | POA: Diagnosis not present

## 2023-04-19 DIAGNOSIS — N179 Acute kidney failure, unspecified: Secondary | ICD-10-CM

## 2023-04-19 DIAGNOSIS — Z7189 Other specified counseling: Secondary | ICD-10-CM

## 2023-04-19 DIAGNOSIS — S7291XA Unspecified fracture of right femur, initial encounter for closed fracture: Secondary | ICD-10-CM | POA: Diagnosis not present

## 2023-04-19 DIAGNOSIS — R296 Repeated falls: Secondary | ICD-10-CM

## 2023-04-19 DIAGNOSIS — G301 Alzheimer's disease with late onset: Secondary | ICD-10-CM | POA: Diagnosis not present

## 2023-04-19 LAB — COMPREHENSIVE METABOLIC PANEL
ALT: 70 U/L — ABNORMAL HIGH (ref 0–44)
AST: 96 U/L — ABNORMAL HIGH (ref 15–41)
Albumin: 2.4 g/dL — ABNORMAL LOW (ref 3.5–5.0)
Alkaline Phosphatase: 87 U/L (ref 38–126)
Anion gap: 12 (ref 5–15)
BUN: 75 mg/dL — ABNORMAL HIGH (ref 8–23)
CO2: 18 mmol/L — ABNORMAL LOW (ref 22–32)
Calcium: 8.1 mg/dL — ABNORMAL LOW (ref 8.9–10.3)
Chloride: 108 mmol/L (ref 98–111)
Creatinine, Ser: 5.27 mg/dL — ABNORMAL HIGH (ref 0.44–1.00)
GFR, Estimated: 7 mL/min — ABNORMAL LOW (ref >60)
Glucose, Bld: 94 mg/dL (ref 70–99)
Potassium: 2.9 mmol/L — ABNORMAL LOW (ref 3.5–5.1)
Sodium: 138 mmol/L (ref 135–145)
Total Bilirubin: 0.6 mg/dL (ref 0.0–1.2)
Total Protein: 5.8 g/dL — ABNORMAL LOW (ref 6.5–8.1)

## 2023-04-19 LAB — VITAMIN B12: Vitamin B-12: 1903 pg/mL — ABNORMAL HIGH (ref 180–914)

## 2023-04-19 LAB — FERRITIN: Ferritin: 1108 ng/mL — ABNORMAL HIGH (ref 11–307)

## 2023-04-19 LAB — CBC
HCT: 22 % — ABNORMAL LOW (ref 36.0–46.0)
Hemoglobin: 6.8 g/dL — CL (ref 12.0–15.0)
MCH: 31.6 pg (ref 26.0–34.0)
MCHC: 30.9 g/dL (ref 30.0–36.0)
MCV: 102.3 fL — ABNORMAL HIGH (ref 80.0–100.0)
Platelets: 292 10*3/uL (ref 150–400)
RBC: 2.15 MIL/uL — ABNORMAL LOW (ref 3.87–5.11)
RDW: 16.4 % — ABNORMAL HIGH (ref 11.5–15.5)
WBC: 8 10*3/uL (ref 4.0–10.5)
nRBC: 0 % (ref 0.0–0.2)

## 2023-04-19 LAB — IRON AND TIBC
Iron: 123 ug/dL (ref 28–170)
Saturation Ratios: 68 % — ABNORMAL HIGH (ref 10.4–31.8)
TIBC: 182 ug/dL — ABNORMAL LOW (ref 250–450)
UIBC: 59 ug/dL

## 2023-04-19 LAB — ABO/RH: ABO/RH(D): A NEG

## 2023-04-19 LAB — PREPARE RBC (CROSSMATCH)

## 2023-04-19 LAB — FOLATE: Folate: 14.6 ng/mL (ref >5.9)

## 2023-04-19 LAB — MAGNESIUM: Magnesium: 1.5 mg/dL — ABNORMAL LOW (ref 1.7–2.4)

## 2023-04-19 SURGERY — FIXATION, FRACTURE, INTERTROCHANTERIC, WITH INTRAMEDULLARY ROD
Anesthesia: Choice | Laterality: Right

## 2023-04-19 MED ORDER — TORSEMIDE 20 MG PO TABS
10.0000 mg | ORAL_TABLET | Freq: Two times a day (BID) | ORAL | Status: DC
Start: 2023-04-19 — End: 2023-04-21
  Administered 2023-04-19 – 2023-04-21 (×4): 10 mg via ORAL
  Filled 2023-04-19 (×5): qty 1

## 2023-04-19 MED ORDER — KETOROLAC TROMETHAMINE 15 MG/ML IJ SOLN
15.0000 mg | Freq: Four times a day (QID) | INTRAMUSCULAR | Status: DC | PRN
  Administered 2023-04-19 – 2023-04-20 (×2): 15 mg via INTRAVENOUS
  Filled 2023-04-19 (×2): qty 1

## 2023-04-19 MED ORDER — PANTOPRAZOLE SODIUM 40 MG PO TBEC
40.0000 mg | DELAYED_RELEASE_TABLET | Freq: Every evening | ORAL | Status: DC
  Administered 2023-04-19 – 2023-04-20 (×2): 40 mg via ORAL
  Filled 2023-04-19 (×3): qty 1

## 2023-04-19 MED ORDER — SENNA 8.6 MG PO TABS: 17.2000 mg | ORAL_TABLET | Freq: Every day | ORAL | Status: DC | PRN

## 2023-04-19 MED ORDER — FUROSEMIDE 40 MG PO TABS: 20.0000 mg | ORAL_TABLET | Freq: Every day | ORAL | Status: DC

## 2023-04-19 MED ORDER — GABAPENTIN 100 MG PO CAPS: 100.0000 mg | ORAL_CAPSULE | Freq: Two times a day (BID) | ORAL | Status: DC

## 2023-04-19 MED ORDER — POTASSIUM CHLORIDE IN NACL 20-0.9 MEQ/L-% IV SOLN
INTRAVENOUS | Status: AC
  Filled 2023-04-19 (×2): qty 1000

## 2023-04-19 MED ORDER — SODIUM CHLORIDE 0.9% IV SOLUTION: Freq: Once | INTRAVENOUS | Status: DC

## 2023-04-19 MED ORDER — METHOTREXATE SODIUM 2.5 MG PO TABS: 10.0000 mg | ORAL_TABLET | ORAL | Status: DC

## 2023-04-19 MED ORDER — LEVOTHYROXINE SODIUM 100 MCG PO TABS
100.0000 ug | ORAL_TABLET | Freq: Every day | ORAL | Status: DC
  Administered 2023-04-19 – 2023-04-22 (×4): 100 ug via ORAL
  Filled 2023-04-19 (×4): qty 1

## 2023-04-19 MED ORDER — ONDANSETRON HCL 4 MG/2ML IJ SOLN: 4.0000 mg | Freq: Four times a day (QID) | INTRAMUSCULAR | Status: DC | PRN

## 2023-04-19 MED ORDER — SERTRALINE HCL 50 MG PO TABS
50.0000 mg | ORAL_TABLET | Freq: Every day | ORAL | Status: DC
  Administered 2023-04-21 – 2023-04-22 (×2): 50 mg via ORAL
  Filled 2023-04-19 (×3): qty 1

## 2023-04-19 MED ORDER — CEPHALEXIN 500 MG PO CAPS
500.0000 mg | ORAL_CAPSULE | Freq: Two times a day (BID) | ORAL | Status: DC
  Administered 2023-04-19: 500 mg via ORAL
  Filled 2023-04-19 (×3): qty 1

## 2023-04-19 MED ORDER — POTASSIUM CHLORIDE 10 MEQ/100ML IV SOLN
10.0000 meq | INTRAVENOUS | Status: AC
  Administered 2023-04-19 (×6): 10 meq via INTRAVENOUS
  Filled 2023-04-19 (×6): qty 100

## 2023-04-19 MED ORDER — FERROUS SULFATE 325 (65 FE) MG PO TABS
325.0000 mg | ORAL_TABLET | ORAL | Status: DC
  Administered 2023-04-19 – 2023-04-21 (×2): 325 mg via ORAL
  Filled 2023-04-19 (×2): qty 1

## 2023-04-19 MED ORDER — METOPROLOL SUCCINATE ER 50 MG PO TB24
50.0000 mg | ORAL_TABLET | Freq: Every day | ORAL | Status: DC
  Administered 2023-04-20 – 2023-04-22 (×3): 50 mg via ORAL
  Filled 2023-04-19 (×3): qty 1

## 2023-04-19 MED ORDER — CEPHALEXIN 500 MG PO CAPS
1000.0000 mg | ORAL_CAPSULE | Freq: Two times a day (BID) | ORAL | Status: DC
Start: 2023-04-19 — End: 2023-04-19
  Administered 2023-04-19: 1000 mg via ORAL
  Filled 2023-04-19: qty 2

## 2023-04-19 MED ORDER — ACETAMINOPHEN 325 MG PO TABS
650.0000 mg | ORAL_TABLET | ORAL | Status: DC | PRN
  Administered 2023-04-20: 650 mg via ORAL
  Filled 2023-04-19: qty 2

## 2023-04-19 MED ORDER — HYDROCHLOROTHIAZIDE 12.5 MG PO TABS: 25.0000 mg | ORAL_TABLET | Freq: Every day | ORAL | Status: DC

## 2023-04-19 MED ORDER — HYDRALAZINE HCL 25 MG PO TABS
25.0000 mg | ORAL_TABLET | Freq: Three times a day (TID) | ORAL | Status: DC
Start: 2023-04-19 — End: 2023-04-22
  Administered 2023-04-19 – 2023-04-22 (×6): 25 mg via ORAL
  Filled 2023-04-19 (×8): qty 1

## 2023-04-19 MED ORDER — LEVOTHYROXINE SODIUM 88 MCG PO TABS: 88.0000 ug | ORAL_TABLET | Freq: Every day | ORAL | Status: DC

## 2023-04-19 MED ORDER — FENTANYL CITRATE PF 50 MCG/ML IJ SOSY
25.0000 ug | PREFILLED_SYRINGE | INTRAMUSCULAR | Status: AC | PRN
  Administered 2023-04-19 (×2): 25 ug via INTRAVENOUS
  Filled 2023-04-19 (×2): qty 1

## 2023-04-19 MED ORDER — MIRTAZAPINE 30 MG PO TABS
30.0000 mg | ORAL_TABLET | Freq: Every day | ORAL | Status: DC
Start: 2023-04-19 — End: 2023-04-19

## 2023-04-19 MED ORDER — DEXTROSE IN LACTATED RINGERS 5 % IV SOLN: INTRAVENOUS | Status: AC

## 2023-04-19 MED ORDER — ONDANSETRON HCL 4 MG PO TABS: 4.0000 mg | ORAL_TABLET | Freq: Four times a day (QID) | ORAL | Status: DC | PRN

## 2023-04-19 MED ORDER — POLYETHYLENE GLYCOL 3350 17 G PO PACK
17.0000 g | PACK | Freq: Every day | ORAL | Status: DC
  Administered 2023-04-21 – 2023-04-22 (×2): 17 g via ORAL
  Filled 2023-04-19 (×2): qty 1

## 2023-04-19 NOTE — Progress Notes (Signed)
 Pt. Received from [facility] ED, via ambulance. Pt A&Ox2-soiled with right thigh medial hematoma and abnormal protrusion. 2nd nurse skin assessment Angelito. BP (!) 179/81 (BP Location: Right Arm)   Pulse 74   Temp 98.6 F (37 C)   Resp 17   Ht 5\' 3"  (1.6 m)   Wt 54.8 kg   SpO2 95%   BMI 21.40 kg/m  Hemorrhoids noted upon assessment. Pt cleaned purewick applied, ice pack applied to hematoma. Pt resting comfortably. Pt family [name]) and spouse bedside. No other needs voiced at this time, 2/4 rails up, wheels locked, bed alarm on and call bell within reach. Noel Gerold 04/19/23

## 2023-04-19 NOTE — Consult Note (Signed)
 Reason for Consult:  right femur injury Referring Physician: Dr. Ashok Cordia Joanne Shaw is an 89 y.o. female.  HPI: 89 y/o female with PMH of dementia, breast cancer and chronic kidney disease on hospice care was brought to the hospital from her assisted living facility last night with right thigh pain and deformity.  By report she is bed bound and doesn't walk.  No information is available about her injury.  She is unable to provide any meaningful history.  She c/o aching pain in the right thigh that is sharp and severe with any movement and better with lying still.  No family is with her.  Past Medical History:  Diagnosis Date   Cancer (HCC) 01/02/2010   right breast (Dr Kathaleen Grinder)   Chronic arthritis    Hyperlipidemia    Hypertension    Hypothyroidism    Occipital neuralgia    30 yrs ago   Osteopenia    Right knee DJD    Senile dementia [facility])     Past Surgical History:  Procedure Laterality Date   APPENDECTOMY     BREAST SURGERY  2-12   right breast segmental mastectomy- Dr Lafayette Dragon   TUBAL LIGATION      History reviewed. No pertinent family history.  Social History:  reports that she quit smoking about 25 years ago. Her smoking use included cigarettes. She does not have any smokeless tobacco history on file. She reports current alcohol use of about 7.0 standard drinks of alcohol per week. She reports that she does not use drugs.  Allergies:  Allergies  Allergen Reactions   Amlodipine Other (See Comments)    Reaction not listed on MAR    Codeine Sulfate Nausea And Vomiting   Demerol [Meperidine] Nausea Only   Lisinopril Other (See Comments)    Reaction not listed on MAR    Oxycodone Other (See Comments)    Reaction not listed on MAR    Penicillins Swelling    Allergy not listed on MAR     Medications: I have reviewed the patient's current medications.  Results for orders placed or performed during the hospital encounter of 04/18/23 (from the past 48 hours)  Basic  metabolic panel     Status: Abnormal   Collection Time: 04/18/23  8:21 PM  Result Value Ref Range   Sodium 133 (L) 135 - 145 mmol/L   Potassium 2.9 (L) 3.5 - 5.1 mmol/L   Chloride 103 98 - 111 mmol/L   CO2 17 (L) 22 - 32 mmol/L   Glucose, Bld 115 (H) 70 - 99 mg/dL    Comment: Glucose reference range applies only to samples taken after fasting for at least 8 hours.   BUN 77 (H) 8 - 23 mg/dL   Creatinine, Ser 1.61 (H) 0.44 - 1.00 mg/dL   Calcium 8.2 (L) 8.9 - 10.3 mg/dL   GFR, Estimated 7 (L) >60 mL/min    Comment: (NOTE) Calculated using the CKD-EPI Creatinine Equation (2021)    Anion gap 13 5 - 15    Comment: Performed at [facility], [address], [place], [place] [address]  CBC with Differential     Status: Abnormal   Collection Time: 04/18/23  8:21 PM  Result Value Ref Range   WBC 10.5 4.0 - 10.5 K/uL   RBC 2.44 (L) 3.87 - 5.11 MIL/uL   Hemoglobin 7.6 (L) 12.0 - 15.0 g/dL   HCT 54.0 (L) 98.1 - 19.1 %   MCV 103.3 (H) 80.0 - 100.0  fL   MCH 31.1 26.0 - 34.0 pg   MCHC 30.2 30.0 - 36.0 g/dL   RDW 03.4 (H) 74.2 - 59.5 %   Platelets 335 150 - 400 K/uL   nRBC 0.0 0.0 - 0.2 %   Neutrophils Relative % 79 %   Neutro Abs 8.2 (H) 1.7 - 7.7 K/uL   Lymphocytes Relative 16 %   Lymphs Abs 1.7 0.7 - 4.0 K/uL   Monocytes Relative 4 %   Monocytes Absolute 0.5 0.1 - 1.0 K/uL   Eosinophils Relative 0 %   Eosinophils Absolute 0.0 0.0 - 0.5 K/uL   Basophils Relative 0 %   Basophils Absolute 0.0 0.0 - 0.1 K/uL   Immature Granulocytes 1 %   Abs Immature Granulocytes 0.10 (H) 0.00 - 0.07 K/uL    Comment: Performed at [facility], [address], [place], [place] [address]  Protime-INR     Status: None   Collection Time: 04/18/23  8:21 PM  Result Value Ref Range   Prothrombin Time 14.0 11.4 - 15.2 seconds   INR 1.1 0.8 - 1.2    Comment: (NOTE) INR goal varies based on device and disease states. Performed at [facility], [address] [place], [place] [address]   Type and screen [facility]     Status: None (Preliminary result)   Collection Time: 04/18/23  8:21 PM  Result Value Ref Range   ABO/RH(D) A NEG    Antibody Screen NEG    Sample Expiration      04/21/2023,2359 Performed at [facility], [address], [place], [place] [address]    Unit Number [id number]    Blood Component Type RBC LR PHER1    Unit division 00    Status of Unit ALLOCATED    Transfusion Status OK TO TRANSFUSE    Crossmatch Result Compatible   ABO/Rh     Status: None   Collection Time: 04/19/23  5:20 AM  Result Value Ref Range   ABO/RH(D)      A NEG Performed at [facility], [address] [place], [place] [address]   CBC     Status: Abnormal   Collection Time: 04/19/23  5:20 AM  Result Value Ref Range   WBC 8.0 4.0 - 10.5 K/uL   RBC 2.15 (L) 3.87 - 5.11 MIL/uL   Hemoglobin 6.8 (LL) 12.0 - 15.0 g/dL    Comment: REPEATED TO VERIFY THIS CRITICAL RESULT HAS VERIFIED AND BEEN CALLED TO ERICA IVKOV , RN BY MEGAN HAYES ON 02 15 2025 AT 0539, AND HAS BEEN READ BACK. CRITICAL RESULT VERIFIED    HCT 22.0 (L) 36.0 - 46.0 %   MCV 102.3 (H) 80.0 - 100.0 fL   MCH 31.6 26.0 - 34.0 pg   MCHC 30.9 30.0 - 36.0 g/dL   RDW 57.3 (H) 22.0 - 25.4 %   Platelets 292 150 - 400 K/uL   nRBC 0.0 0.0 - 0.2 %    Comment: Performed at [facility], [address], [place], [place] [address]  Comprehensive metabolic panel     Status: Abnormal   Collection Time: 04/19/23  5:20 AM  Result Value Ref Range   Sodium 138 135 - 145 mmol/L   Potassium 2.9 (L) 3.5 - 5.1 mmol/L   Chloride 108 98 - 111 mmol/L   CO2 18 (L) 22 - 32 mmol/L   Glucose, Bld 94 70 - 99 mg/dL  Comment: Glucose reference range applies only to samples taken after fasting for at least 8 hours.   BUN 75 (H) 8 - 23 mg/dL   Creatinine, Ser 4.09 (H) 0.44 - 1.00 mg/dL   Calcium 8.1 (L) 8.9 - 10.3 mg/dL    Total Protein 5.8 (L) 6.5 - 8.1 g/dL   Albumin 2.4 (L) 3.5 - 5.0 g/dL   AST 96 (H) 15 - 41 U/L   ALT 70 (H) 0 - 44 U/L   Alkaline Phosphatase 87 38 - 126 U/L   Total Bilirubin 0.6 0.0 - 1.2 mg/dL   GFR, Estimated 7 (L) >60 mL/min    Comment: (NOTE) Calculated using the CKD-EPI Creatinine Equation (2021)    Anion gap 12 5 - 15    Comment: Performed at [facility], [address] [place], [place] [address]  Prepare RBC (crossmatch)     Status: None   Collection Time: 04/19/23  8:01 AM  Result Value Ref Range   Order Confirmation      ORDER PROCESSED BY BLOOD BANK Performed at [facility], [address], [place], [place] [address]     DG Chest Portable 1 View Result Date: 04/18/2023 CLINICAL DATA:  Right femur fracture. EXAM: PORTABLE CHEST 1 VIEW COMPARISON:  Chest radiograph 12/09/2022 FINDINGS: Chronic cardiomegaly. Unchanged mediastinal contours. Aortic atherosclerosis. Chronic interstitial coarsening without focal airspace disease. No pleural fluid or pneumothorax. No acute osseous findings. Surgical clips project over the right hemithorax. IMPRESSION: 1. No acute findings. 2. Chronic interstitial coarsening. Electronically Signed   By: Narda Rutherford M.D.   On: 04/18/2023 21:30   DG Femur Portable Min 2 Views Right Result Date: 04/18/2023 CLINICAL DATA:  Deformity.  Right hip pain. EXAM: RIGHT FEMUR PORTABLE 2 VIEW COMPARISON:  None Available. FINDINGS: The bones are diffusely under mineralized. There is displaced fracture of the proximal femoral shaft. Osseous overriding of approximately 4 cm. Previous pinning of the right femoral neck. Advanced right hip arthropathy. The distal femur is intact. Knee alignment is maintained. IMPRESSION: Displaced proximal femoral shaft fracture with osseous overriding. Electronically Signed   By: Narda Rutherford M.D.   On: 04/18/2023 21:28    ROS:  can't be obtained due patient's mental status PE:  Blood  pressure (!) 164/56, pulse (!) 57, temperature 98 F (36.7 C), temperature source Oral, resp. rate 16, height 5\' 3"  (1.6 m), weight 54.8 kg, SpO2 95%. Thin, chronically ill appearing elderly woman in nad.  Alert.  Oriented to person only.  EOMI.  Resp unlabored.  R thigh with gross deformity.  Significant flexion contracture of the knee.  No lympadenopahty.  Skin intact.  2+ dp pulse.  Intact sens to LT dorsally and plantarly at the foot.  Active PF and DF of the toes.  Assessment/Plan:  displaced right femoral shaft fracture in a bed bound patient with previous hardware at the proximal femur.  She has multiple electrolyte abnormalities, anemia and severe kidney disease.  She is not a candidate for surgery today and requires medical optimization.  She is on hospice care and is DNR.  By report she is at nonambulatory at base line.  Surgical treatment of this fracture is complicated and difficult due to the presence of proximal hardware, poor bone quality and the patient's frail health.  There are significant risks and limited benefits.  I phoned her son to discuss the risks and benefits of the treatment options.  I was unable to reach him and left a vm.  A palliative care consult will be helpful to establish options for comfort care.  We'll follow.  OR cancelled for today and posting moved tentatively to tomorrow pending discussion with family.  I spoke with Dr. Jena Gauss and Dr. [place] to formulate the treatment plan.  Toni Arthurs 04/19/2023, 8:33 AM

## 2023-04-19 NOTE — Progress Notes (Signed)
 PHARMACY NOTE:  ANTIMICROBIAL RENAL DOSAGE ADJUSTMENT  Current antimicrobial regimen includes a mismatch between antimicrobial dosage and estimated renal function.  As per policy approved by the Pharmacy & Therapeutics and Medical Executive Committees, the antimicrobial dosage will be adjusted accordingly.  Current antimicrobial dosage: cephalexin 1000 mg BID  Indication: cellulitis (on PTA)  Renal Function:  Estimated Creatinine Clearance: 5.9 mL/min (A) (by C-G formula based on SCr of 5.27 mg/dL (H)).     Antimicrobial dosage has been changed to: cephalexin 500 mg BID   Thank you for allowing pharmacy to be a part of this patient's care.  Pricilla Riffle, PharmD, BCPS Clinical Pharmacist 04/19/2023 12:33 PM

## 2023-04-19 NOTE — Progress Notes (Addendum)
 [facility] Patient Note   Joanne Shaw is a current hospice patient with a terminal diagnosis of End Stage Renal Disease. Facility notified [facility] that patient had been screaming and in pain whrn right leg touched. States that patient sustained a fall prior. Nurse came and evaluated patient and she appeared to be in severe pain. Leg looked deformed. Attempted to have mobile x-ray, however patient unable to hold still to have this completed. Staff notified family and it was decided to activate EMS and have patient evaluated at hospital. Patient was admitted to [facility] on 2.14. 25 with a diagnosis of right femoral shaft fracture.  Per Dr. Barbee Shropshire with [facility], this is a related hospital admission.  No contact with patient today, as patient likely to be transferred to [facility]. Attempted to contact son [name], message left. She is inpatient appropriate with need for surgical intervention due to fracture.  Vital Signs: 98.2/63/9    184/73     95% on ra  I&O 316/none documented  Abnormal labs:   04/19/23 05:20 Potassium: 2.9 (L) CO2: 18 (L) BUN: 75 (H) Creatinine: 5.27 (H) Calcium: 8.1 (L) Albumin: 2.4 (L) AST: 96 (H) ALT: 70 (H) Total Protein: 5.8 (L) Total Bilirubin: 0.6 GFR, Estimated: 7 (L) RBC: 2.15 (L) Hemoglobin: 6.8 (LL) HCT: 22.0 (L) MCV: 102.3 (H) RDW: 16.4 (H)  Diagnostics: Results from 2.14.25  DG Chest Portable 1 View  FINDINGS: Chronic cardiomegaly. Unchanged mediastinal contours. Aortic atherosclerosis. Chronic interstitial coarsening without focal airspace disease. No pleural fluid or pneumothorax. No acute osseous findings. Surgical clips project over the right hemithorax.   IMPRESSION: 1. No acute findings. 2. Chronic interstitial coarsening.  DG Femur Portable Min 2 Views Right   FINDINGS: The bones are diffusely under mineralized. There is displaced fracture of the  proximal femoral shaft. Osseous overriding of approximately 4 cm. Previous pinning of the right femoral neck. Advanced right hip arthropathy. The distal femur is intact. Knee alignment is maintained.   IMPRESSION: Displaced proximal femoral shaft fracture with osseous overriding.   IV/PRN Meds: Potassium IV x6, Fentanyl IV x2, Fentanyl IV x1,   Problem list as per Eric U[place], DO 2.15.25  Displaced right femoral shaft fracture Patient presenting from facility after staff hearing a popping sensation on transferring between wheelchair and the bed.  Patient with significant discomfort and imaging on admission notable for displaced proximal femoral shaft fracture.  Discussed with orthopedics, Dr. Victorino Dike; patient transferring to [name] for evaluation by trauma orthopedic service, Dr. Jena Gauss with possible surgical intervention on 04/20/2023; although very complicated picture with patient having previous orthopedic hardware in place, in the setting of severe dementia, bedbound status and likely poor bone health. -- Orthopedics following, appreciate assistance -- Palliative care consulted for assistance with goals of care/medical decision making given overall poor prognosis -- Pending transfer to [name] for evaluation by orthopedic trauma service -- Toradol 15 mg IV every 6 hours as needed moderate pain -- Fentanyl 25 mg IV every 2 hours as needed severe pain   Acute blood loss anemia Macrocytic anemia Hemoglobin 7.6 on arrival to the ED, dropping to 6.8 this morning.  Etiology likely secondary to long bone fracture. -- Check anemia panel -- Transfuse 1 unit PRBC -- Repeat H&H following transfusion -- Ferrous sulfate 325 mg every other day -- Repeat CBC in the a.m.   Acute renal failure on CKD stage IIIb -- Cr 5.70>5.27 -- Continue IVF w/ NS  w/ 20 Kcl at 75 mL/h -- Avoid nephrotoxins, renally dose all medications -- Repeat BMP in a.m.   Cellulitis Has been  taking Keflex outpatient for presumed cellulitis of unclear etiology.  Will continue Keflex for now.   Hypokalemia Potassium 2.9, will replete. -- Repeat electrolytes in a.m. to include magnesium   Essential hypertension -- Metoprolol succinate 50 mg p.o. daily --Hydralazine 25 mg p.o. 3 times daily -- Torsemide 10 mg p.o. twice daily   Hyperlipidemia Currently not on statin outpatient   Hypothyroidism -- Levothyroxine 100 mcg p.o. daily   GERD -- Protonix 40 mg p.o. daily   Dementia Severe protein calorie malnutrition --Delirium precautions --Get up during the day --Encourage a familiar face to remain present throughout the day --Keep blinds open and lights on during daylight hours --Minimize the use of opioids/benzodiazepines --Melatonin 3 mg p.o. nightly --Follows with hospice outpatient  Discharge Planning: Ongoing  Family contact: Attempted to contact son [name] via phone. Message left.   IDT: Updated  Goals of Care:  DNR-Limited  Should patient need ambulance transfer at discharge- please use [facility]) as they contract this service for our active hospice patients.   Roe Rutherford, BSN, RN Hospice Nurse Liaison [phone]

## 2023-04-19 NOTE — Progress Notes (Signed)
 PROGRESS NOTE    Joanne Shaw  WUJ:[id number] DOB: 05/06/1930 DOA: 04/18/2023 PCP: Christene Lye, FNP    Brief Narrative:   Joanne Shaw is a 89 y.o. female with past medical history significant for HTN, HLD, hypothyroidism, history of right breast cancer, dementia on hospice at facility who presented to [facility] ED from [facility] assisted living with right hip pain.  Apparently patient was being transferred between the wheelchair and bed when staff noted a "popping sensation".  Patient appeared to be in significant discomfort and EMS was activated.  Patient received 50 mcg IV fentanyl and route to the ED.  In the ED, temperature 97.4 F, HR 62, RR 16, BP 172/76, SpO2 97% on room air.  WBC 10.5, hemoglobin 7.6, platelet count 335, MCV 103.3.  Sodium 133, potassium 2.9, chloride 103, CO2 17, glucose 115, BUN 77, creatinine 5.70.  Chest x-ray with no acute cardiopulmonary findings.  Right femur x-ray with displaced proximal femoral shaft fracture with osseous overriding.  Orthopedics was consulted.  TRH consulted for admission for further evaluation and management of right femoral shaft fracture.  Assessment & Plan:   Displaced right femoral shaft fracture Patient presenting from facility after staff hearing a popping sensation on transferring between wheelchair and the bed.  Patient with significant discomfort and imaging on admission notable for displaced proximal femoral shaft fracture.  Discussed with orthopedics, Dr. Victorino Dike; patient transferring to [name] for evaluation by trauma orthopedic service, Dr. Jena Gauss with possible surgical intervention on 04/20/2023; although very complicated picture with patient having previous orthopedic hardware in place, in the setting of severe dementia, bedbound status and likely poor bone health. -- Orthopedics following, appreciate assistance -- Palliative care consulted for assistance with goals of care/medical decision making given overall  poor prognosis -- Pending transfer to [name] for evaluation by orthopedic trauma service -- Toradol 15 mg IV every 6 hours as needed moderate pain -- Fentanyl 25 mg IV every 2 hours as needed severe pain  Acute blood loss anemia Macrocytic anemia Hemoglobin 7.6 on arrival to the ED, dropping to 6.8 this morning.  Etiology likely secondary to long bone fracture. -- Check anemia panel -- Transfuse 1 unit PRBC -- Repeat H&H following transfusion -- Ferrous sulfate 325 mg every other day -- Repeat CBC in the a.m.  Acute renal failure on CKD stage IIIb -- Cr 5.70>5.27 -- Continue IVF w/ NS w/ 20 Kcl at 75 mL/h -- Avoid nephrotoxins, renally dose all medications -- Repeat BMP in a.m.  Cellulitis Has been taking Keflex outpatient for presumed cellulitis of unclear etiology.  Will continue Keflex for now.  Hypokalemia Potassium 2.9, will replete. -- Repeat electrolytes in a.m. to include magnesium  Essential hypertension -- Metoprolol succinate 50 mg p.o. daily --Hydralazine 25 mg p.o. 3 times daily -- Torsemide 10 mg p.o. twice daily  Hyperlipidemia Currently not on statin outpatient  Hypothyroidism -- Levothyroxine 100 mcg p.o. daily  GERD -- Protonix 40 mg p.o. daily  Dementia Severe protein calorie malnutrition --Delirium precautions --Get up during the day --Encourage a familiar face to remain present throughout the day --Keep blinds open and lights on during daylight hours --Minimize the use of opioids/benzodiazepines --Melatonin 3 mg p.o. nightly --Follows with hospice outpatient   DVT prophylaxis: SCDs Start: 04/19/23 0201    Code Status: Limited: Do not attempt resuscitation (DNR) -DNR-LIMITED -Do Not Intubate/DNI  Family Communication: Updated patient's son, Joanne Shaw via telephone this morning  Disposition Plan:  Level of care: Med-Surg Status is:  Inpatient Remains inpatient appropriate because: Pending surgical management of right femur fracture     Consultants:  Orthopedics, Dr. Victorino Dike, Dr. Jena Gauss  Procedures:  None  Antimicrobials:  Keflex   Subjective: Patient seen examined bedside, lying in bed.  In mild discomfort from femur fracture.  Pleasantly confused.  RN present at bedside.  No family present this morning.  Orthopedics, Dr. Victorino Dike at bedside, discussed case with pending transfer to [facility].  Unfortunately overall poor prognosis given her advanced age, dementia, comorbidities and poor bone health; and may need to consider conservative measures with just pain control as she may not do well with anesthesia.  Palliative care consulted for assistance with goals of care and medical decision making.  Unable to obtain any further ROS from patient given her advanced dementia.  No acute concerns per RN.  Objective: Vitals:   04/19/23 1030 04/19/23 1100 04/19/23 1109 04/19/23 1130  BP: (!) 182/64 (!) 184/73  (!) 146/129  Pulse: 62 63  80  Resp: 10 (!) 9  11  Temp:   98.2 F (36.8 C)   TempSrc:   Oral   SpO2: 96% 95%  94%  Weight:      Height:       No intake or output data in the 24 hours ending 04/19/23 1209 Filed Weights   04/18/23 1949 04/18/23 1950  Weight: 54.8 kg 54.8 kg    Examination:  Physical Exam: GEN: Mild distress from pain, pleasantly confused, chronically ill in appearance HEENT: NCAT, PERRL, EOMI, sclera clear, dry mucous membranes PULM: CTAB w/o wheezes/crackles, normal respiratory effort, on room air CV: RRR w/o M/G/R GI: abd soft, NTND, + BS MSK: no peripheral edema, right femur with gross deformity with skin intact, NEURO: No focal neurological deficit PSYCH: normal mood/affect Integumentary: No concerning rashes/lesions/wounds noted on exposed skin surfaces    Data Reviewed: I have personally reviewed following labs and imaging studies  CBC: Recent Labs  Lab 04/18/23 2021 04/19/23 0520  WBC 10.5 8.0  NEUTROABS 8.2*  --   HGB 7.6* 6.8*  HCT 25.2* 22.0*  MCV 103.3* 102.3*  PLT  335 292   Basic Metabolic Panel: Recent Labs  Lab 04/18/23 2021 04/19/23 0520  NA 133* 138  K 2.9* 2.9*  CL 103 108  CO2 17* 18*  GLUCOSE 115* 94  BUN 77* 75*  CREATININE 5.70* 5.27*  CALCIUM 8.2* 8.1*   GFR: Estimated Creatinine Clearance: 5.9 mL/min (A) (by C-G formula based on SCr of 5.27 mg/dL (H)). Liver Function Tests: Recent Labs  Lab 04/19/23 0520  AST 96*  ALT 70*  ALKPHOS 87  BILITOT 0.6  PROT 5.8*  ALBUMIN 2.4*   No results for input(s): "LIPASE", "AMYLASE" in the last 168 hours. No results for input(s): "AMMONIA" in the last 168 hours. Coagulation Profile: Recent Labs  Lab 04/18/23 2021  INR 1.1   Cardiac Enzymes: No results for input(s): "CKTOTAL", "CKMB", "CKMBINDEX", "TROPONINI" in the last 168 hours. BNP (last 3 results) No results for input(s): "PROBNP" in the last 8760 hours. HbA1C: No results for input(s): "HGBA1C" in the last 72 hours. CBG: No results for input(s): "GLUCAP" in the last 168 hours. Lipid Profile: No results for input(s): "CHOL", "HDL", "LDLCALC", "TRIG", "CHOLHDL", "LDLDIRECT" in the last 72 hours. Thyroid Function Tests: No results for input(s): "TSH", "T4TOTAL", "FREET4", "T3FREE", "THYROIDAB" in the last 72 hours. Anemia Panel: No results for input(s): "VITAMINB12", "FOLATE", "FERRITIN", "TIBC", "IRON", "RETICCTPCT" in the last 72 hours. Sepsis Labs: No results for  input(s): "PROCALCITON", "LATICACIDVEN" in the last 168 hours.  No results found for this or any previous visit (from the past 240 hours).       Radiology Studies: DG Chest Portable 1 View Result Date: 04/18/2023 CLINICAL DATA:  Right femur fracture. EXAM: PORTABLE CHEST 1 VIEW COMPARISON:  Chest radiograph 12/09/2022 FINDINGS: Chronic cardiomegaly. Unchanged mediastinal contours. Aortic atherosclerosis. Chronic interstitial coarsening without focal airspace disease. No pleural fluid or pneumothorax. No acute osseous findings. Surgical clips project over  the right hemithorax. IMPRESSION: 1. No acute findings. 2. Chronic interstitial coarsening. Electronically Signed   By: Narda Rutherford M.D.   On: 04/18/2023 21:30   DG Femur Portable Min 2 Views Right Result Date: 04/18/2023 CLINICAL DATA:  Deformity.  Right hip pain. EXAM: RIGHT FEMUR PORTABLE 2 VIEW COMPARISON:  None Available. FINDINGS: The bones are diffusely under mineralized. There is displaced fracture of the proximal femoral shaft. Osseous overriding of approximately 4 cm. Previous pinning of the right femoral neck. Advanced right hip arthropathy. The distal femur is intact. Knee alignment is maintained. IMPRESSION: Displaced proximal femoral shaft fracture with osseous overriding. Electronically Signed   By: Narda Rutherford M.D.   On: 04/18/2023 21:28        Scheduled Meds:  sodium chloride   Intravenous Once   cephALEXin  1,000 mg Oral BID   ferrous sulfate  325 mg Oral QODAY   hydrALAZINE  25 mg Oral TID   levothyroxine  100 mcg Oral Q0600   metoprolol succinate  50 mg Oral Daily   pantoprazole  40 mg Oral QPM   polyethylene glycol  17 g Oral Daily   sertraline  50 mg Oral Daily   torsemide  10 mg Oral BID   Continuous Infusions:  0.9 % NaCl with KCl 20 mEq / L     dextrose 5% lactated ringers 40 mL/hr at 04/19/23 0224   potassium chloride 10 mEq (04/19/23 1146)     LOS: 1 day    Time spent: 56 minutes spent on chart review, discussion with nursing staff, consultants, updating family and interview/physical exam; more than 50% of that time was spent in counseling and/or coordination of care.    Alvira Philips [place], DO [facility] Available via Epic secure chat 7am-7pm After these hours, please refer to coverage provider listed on [email or web address] 04/19/2023, 12:09 PM

## 2023-04-19 NOTE — ED Notes (Signed)
 Spoke to OR and OR requesting blood and labs improvement before surgery. Spoke to Dr. [place] and was informed about labs and need for blood consent. He is working on contacting family. Blood admission will start once consent resolved.

## 2023-04-19 NOTE — Progress Notes (Signed)
 Orthopedic Tech Progress Note Patient Details:  Joanne Shaw 1930-10-18 [id number]  Patient ID: [name], female   DOB: Jun 04, 1930, 89 y.o.   MRN: [id number] Pt. Delined to have Knee Immobilizer placed because of severe pain. Tonye Pearson 04/19/2023, 10:27 AM

## 2023-04-19 NOTE — ED Notes (Signed)
 Blood consent given to Dr. [place] by son verbally.

## 2023-04-19 NOTE — Consult Note (Signed)
 Consultation Note Date: 04/19/2023   Patient Name: Joanne Shaw  DOB: [birth year]  MRN: [id number]  Age / Sex: 89 y.o., female   PCP: Christene Lye, FNP Referring Physician: [place], Eric J, DO  Reason for Consultation: Establishing goals of care     Chief Complaint/History of Present Illness:   Patient is a 89 year old female with a past medical history of right breast cancer, hyperlipidemia, hypertension, hypothyroidism, and dementia who was receiving long-term care management at a facility with hospice support was admitted on 04/18/2023 for management after a potential fall sustaining displaced right femoral shaft fracture.  Patient seen in [place] ER for management and orthopedics consulted for evaluation.  Patient also receiving medical management for acute blood loss anemia, acute kidney injury on CKD, and multiple electrolyte abnormalities.  Palliative medicine team consulted to assist with complex medical decision making.  Extensive review of EMR prior to presenting to bedside.  Also able to review patient's documentation from nursing facility at bedside including patient's DNR/DNI form.  When presenting to bedside to see patient in ER, patient laying in bed and appears uncomfortable.  No family present at bedside. Staff working to obtain further IV access.  Patient appears chronically ill and grimaces frequently secondary to pain.  Patient is awake and can interact with this provider though was overall confused.  Tried to provide emotional support for patient at bedside as she seemed distressed about current situation and overwhelmed.   Did discuss care with RN for medical updates. Then able to discuss care with hospitalist for medical updates.  Hospitalist and orthopedist had chance to speak with patient's son prior to this provider seeing patient.  After discussion, son has pursued patient being transferred to [facility] for orthopedic intervention.  Patient potentially going to  the OR today.  Concerns noted with patient's history of underlying dementia and poor nutritional status.  Noted palliative medicine team continuing to follow upon transfer.  Primary Diagnoses  Present on Admission:  Right femoral fracture (HCC)  Malignant neoplasm of female breast (HCC)  Hypothyroidism  Essential hypertension  Stage 3b chronic kidney disease (HCC)  Severe protein-calorie malnutrition (HCC)  Hypokalemia  Hypomagnesemia  Alzheimer's dementia with anxiety [facility])  Past Medical History:  Diagnosis Date   Cancer (HCC) 01/02/2010   right breast (Dr Kathaleen Grinder)   Chronic arthritis    Hyperlipidemia    Hypertension    Hypothyroidism    Occipital neuralgia    30 yrs ago   Osteopenia    Right knee DJD    Senile dementia (HCC)    Social History   Socioeconomic History   Marital status: Widowed    Spouse name: Not on file   Number of children: Not on file   Years of education: Not on file   Highest education level: Not on file  Occupational History   Not on file  Tobacco Use   Smoking status: Former    Current packs/day: 0.00    Types: Cigarettes    Quit date: 03/04/1998    Years since quitting: 25.1   Smokeless tobacco: Not on file  Substance and Sexual Activity   Alcohol use: Yes    Alcohol/week: 7.0 standard drinks of alcohol    Types: 7 Glasses of wine per week    Comment: with dinner   Drug use: No   Sexual activity: Not on file  Other Topics Concern   Not on file  Social History Narrative   Not on file   Social Drivers of  Health   Financial Resource Strain: Low Risk  (12/23/2021)   Received from [facility], [facility]   Overall Financial Resource Strain (CARDIA)    Difficulty of Paying Living Expenses: Not hard at all  Food Insecurity: No Food Insecurity (12/11/2022)   Hunger Vital Sign    Worried About Running Out of Food in the Last Year: Never true    Ran Out of Food in the Last Year: Never true  Transportation Needs: No Transportation  Needs (12/11/2022)   PRAPARE - Administrator, Civil Service (Medical): No    Lack of Transportation (Non-Medical): No  Physical Activity: Inactive (12/23/2021)   Received from [facility], [facility]   Exercise Vital Sign    Days of Exercise per Week: 0 days    Minutes of Exercise per Session: 0 min  Stress: No Stress Concern Present (12/23/2021)   Received from [facility], [facility] of Occupational Health - Occupational Stress Questionnaire    Feeling of Stress : Not at all  Social Connections: Unknown (12/30/2022)   Received from [facility]   Social Network    Social Network: Not on file   History reviewed. No pertinent family history. Scheduled Meds:  sodium chloride   Intravenous Once   cephALEXin  1,000 mg Oral BID   ferrous sulfate  325 mg Oral QODAY   gabapentin  100 mg Oral BID   hydrALAZINE  25 mg Oral TID   levothyroxine  100 mcg Oral Q0600   metoprolol succinate  50 mg Oral Daily   pantoprazole  40 mg Oral QPM   polyethylene glycol  17 g Oral Daily   sertraline  50 mg Oral Daily   torsemide  10 mg Oral BID   Continuous Infusions:  0.9 % NaCl with KCl 20 mEq / L     dextrose 5% lactated ringers 40 mL/hr at 04/19/23 0224   potassium chloride 10 mEq (04/19/23 0946)   PRN Meds:.acetaminophen, fentaNYL (SUBLIMAZE) injection, ketorolac, ondansetron **OR** ondansetron (ZOFRAN) IV, senna Allergies  Allergen Reactions   Amlodipine Other (See Comments)    Reaction not listed on MAR    Codeine Sulfate Nausea And Vomiting   Demerol [Meperidine] Nausea Only   Lisinopril Other (See Comments)    Reaction not listed on MAR    Oxycodone Other (See Comments)    Reaction not listed on MAR    Penicillins Swelling    Allergy not listed on MAR    CBC:    Component Value Date/Time   WBC 8.0 04/19/2023 0520   HGB 6.8 (LL) 04/19/2023 0520   HCT 22.0 (L) 04/19/2023 0520   PLT 292 04/19/2023 0520   MCV 102.3 (H) 04/19/2023 0520    NEUTROABS 8.2 (H) 04/18/2023 2021   LYMPHSABS 1.7 04/18/2023 2021   MONOABS 0.5 04/18/2023 2021   EOSABS 0.0 04/18/2023 2021   BASOSABS 0.0 04/18/2023 2021   Comprehensive Metabolic Panel:    Component Value Date/Time   NA 138 04/19/2023 0520   K 2.9 (L) 04/19/2023 0520   CL 108 04/19/2023 0520   CO2 18 (L) 04/19/2023 0520   BUN 75 (H) 04/19/2023 0520   CREATININE 5.27 (H) 04/19/2023 0520   GLUCOSE 94 04/19/2023 0520   CALCIUM 8.1 (L) 04/19/2023 0520   AST 96 (H) 04/19/2023 0520   ALT 70 (H) 04/19/2023 0520   ALKPHOS 87 04/19/2023 0520   BILITOT 0.6 04/19/2023 0520   PROT 5.8 (L) 04/19/2023 1610  ALBUMIN 2.4 (L) 04/19/2023 0520    Physical Exam: Vital Signs: BP (!) 168/65   Pulse 63   Temp 98 F (36.7 C) (Oral)   Resp 13   Ht 5\' 3"  (1.6 m)   Wt 54.8 kg   SpO2 96%   BMI 21.40 kg/m  SpO2: SpO2: 96 % O2 Device: O2 Device: Room Air O2 Flow Rate:   Intake/output summary: No intake or output data in the 24 hours ending 04/19/23 1020 LBM:   Baseline Weight: Weight: 54.8 kg Most recent weight: Weight: 54.8 kg  General: Awake, confused, chronically ill-appearing, grimacing frequently HENT: Dry mucous membranes Cardiovascular: RRR Respiratory: no increased work of breathing noted, not in respiratory distress:  Neuro: Awake, confused          Palliative Performance Scale: 20%              Additional Data Reviewed: Recent Labs    04/18/23 2021 04/19/23 0520  WBC 10.5 8.0  HGB 7.6* 6.8*  PLT 335 292  NA 133* 138  BUN 77* 75*  CREATININE 5.70* 5.27*    Imaging: DG Chest Portable 1 View CLINICAL DATA:  Right femur fracture.  EXAM: PORTABLE CHEST 1 VIEW  COMPARISON:  Chest radiograph 12/09/2022  FINDINGS: Chronic cardiomegaly. Unchanged mediastinal contours. Aortic atherosclerosis. Chronic interstitial coarsening without focal airspace disease. No pleural fluid or pneumothorax. No acute osseous findings. Surgical clips project over the right  hemithorax.  IMPRESSION: 1. No acute findings. 2. Chronic interstitial coarsening.  Electronically Signed   By: Narda Rutherford M.D.   On: 04/18/2023 21:30 DG Femur Portable Min 2 Views Right CLINICAL DATA:  Deformity.  Right hip pain.  EXAM: RIGHT FEMUR PORTABLE 2 VIEW  COMPARISON:  None Available.  FINDINGS: The bones are diffusely under mineralized. There is displaced fracture of the proximal femoral shaft. Osseous overriding of approximately 4 cm. Previous pinning of the right femoral neck. Advanced right hip arthropathy. The distal femur is intact. Knee alignment is maintained.  IMPRESSION: Displaced proximal femoral shaft fracture with osseous overriding.  Electronically Signed   By: Narda Rutherford M.D.   On: 04/18/2023 21:28    I personally reviewed recent imaging.   Palliative Care Assessment and Plan Summary of Established Goals of Care and Medical Treatment Preferences   Patient is a 89 year old female with a past medical history of right breast cancer, hyperlipidemia, hypertension, hypothyroidism, and dementia who was receiving long-term care management at a facility with hospice support was admitted on 04/18/2023 for management after a potential fall sustaining displaced right femoral shaft fracture.  Patient seen in [place] ER for management and orthopedics consulted for evaluation.  Patient also receiving medical management for acute blood loss anemia, acute kidney injury on CKD, and multiple electrolyte abnormalities.  Palliative medicine team consulted to assist with complex medical decision making.  # Complex medical decision making/goals of care  -Patient seen in ER for initial consult.  No family present at bedside.  Patient unable to engage in complex medical decision-making due to her medical status.  Discussed care with hospitalist who noted son has spoken with providers and decided to pursue orthopedic intervention.  Patient will be transferred to  [facility] for surgery potentially today.  Concerns about outcomes with patient's cognitive impairment and nutritional status.  Palliative medicine team to continue following along with patient's care upon transfer from to engage in conversations as appropriate regarding pathways for medical care moving forward.  - Of note, patient was followed by  [facility] hospice facility.  -  Code Status: Limited: Do not attempt resuscitation (DNR) -DNR-LIMITED -Do Not Intubate/DNI    # Psycho-social/Spiritual Support:  - Support System: Son noted in EMR  # Discharge Planning:  To Be Determined  Thank you for allowing the palliative care team to participate in the care [name].  Alvester Morin, DO Palliative Care Provider PMT # [phone]  If patient remains symptomatic despite maximum doses, please call PMT at [phone] between 0700 and 1900. Outside of these hours, please call attending, as PMT does not have night coverage.  Personally spent 60 minutes in patient care including extensive chart review (labs, imaging, progress/consult notes, vital signs), medically appropraite exam, discussed with treatment team, education to patient, family, and staff, documenting clinical information, medication review and management, coordination of care, and available advanced directive documents.

## 2023-04-19 NOTE — ED Notes (Signed)
 Per Dr. [place] Mr. Lafon the pts son was contacted and gave consent for blood transfusion.

## 2023-04-19 NOTE — Progress Notes (Signed)
    Patient Name: Joanne Shaw           DOB: June 20, 1930  MRN: [id number]      Admission Date: 04/18/2023  Attending Provider: [place], Eric J, DO  Primary Diagnosis: Right femoral fracture [facility])   Level of care: Med-Surg    CROSS COVER NOTE   Date of Service   04/19/2023   [name], 89 y.o. female, was admitted on 04/18/2023 for Right femoral fracture (HCC).    HPI/Events of Note   Anemia is the setting of CKD Stage IIIb Hemoglobin 7.6 -->  6.8.  No acute changes reported.  Hemodynamically stable. No melena, hematochezia, or other bleeding reported tonight.    Interventions/ Plan   Blood transfusion, 1 unit PRBC Recheck H&H after transfusion is completed, transfuse if HGB <7        Anthoney Harada, DNP, [employer] [facility] [place]

## 2023-04-20 ENCOUNTER — Encounter (HOSPITAL_COMMUNITY): Payer: Self-pay | Admitting: Internal Medicine

## 2023-04-20 ENCOUNTER — Other Ambulatory Visit: Payer: Self-pay

## 2023-04-20 ENCOUNTER — Inpatient Hospital Stay (HOSPITAL_COMMUNITY): Payer: Medicare Other | Admitting: Anesthesiology

## 2023-04-20 ENCOUNTER — Encounter (HOSPITAL_COMMUNITY)
Admission: EM | Disposition: A | Discharge status: Hospice | Payer: Self-pay | Source: Skilled Nursing Facility | Attending: Family Medicine

## 2023-04-20 ENCOUNTER — Inpatient Hospital Stay (HOSPITAL_COMMUNITY): Payer: Medicare Other

## 2023-04-20 DIAGNOSIS — S72001A Fracture of unspecified part of neck of right femur, initial encounter for closed fracture: Secondary | ICD-10-CM | POA: Diagnosis not present

## 2023-04-20 DIAGNOSIS — E039 Hypothyroidism, unspecified: Secondary | ICD-10-CM | POA: Diagnosis not present

## 2023-04-20 DIAGNOSIS — S72351A Displaced comminuted fracture of shaft of right femur, initial encounter for closed fracture: Secondary | ICD-10-CM | POA: Diagnosis not present

## 2023-04-20 DIAGNOSIS — Z515 Encounter for palliative care: Secondary | ICD-10-CM

## 2023-04-20 DIAGNOSIS — E785 Hyperlipidemia, unspecified: Secondary | ICD-10-CM

## 2023-04-20 DIAGNOSIS — I10 Essential (primary) hypertension: Secondary | ICD-10-CM

## 2023-04-20 DIAGNOSIS — S7291XA Unspecified fracture of right femur, initial encounter for closed fracture: Secondary | ICD-10-CM

## 2023-04-20 DIAGNOSIS — D649 Anemia, unspecified: Secondary | ICD-10-CM

## 2023-04-20 HISTORY — PX: ORIF FEMUR FRACTURE: SHX2119

## 2023-04-20 LAB — BASIC METABOLIC PANEL
Anion gap: 14 (ref 5–15)
BUN: 72 mg/dL — ABNORMAL HIGH (ref 8–23)
CO2: 16 mmol/L — ABNORMAL LOW (ref 22–32)
Calcium: 8.3 mg/dL — ABNORMAL LOW (ref 8.9–10.3)
Chloride: 107 mmol/L (ref 98–111)
Creatinine, Ser: 5.56 mg/dL — ABNORMAL HIGH (ref 0.44–1.00)
GFR, Estimated: 7 mL/min — ABNORMAL LOW (ref >60)
Glucose, Bld: 78 mg/dL (ref 70–99)
Potassium: 4 mmol/L (ref 3.5–5.1)
Sodium: 137 mmol/L (ref 135–145)

## 2023-04-20 LAB — TYPE AND SCREEN
ABO/RH(D): A NEG
Antibody Screen: NEGATIVE
Unit division: 0

## 2023-04-20 LAB — BPAM RBC
Blood Product Expiration Date: 202502242359
ISSUE DATE / TIME: 202502151230
Unit Type and Rh: 600

## 2023-04-20 LAB — MAGNESIUM: Magnesium: 1.4 mg/dL — ABNORMAL LOW (ref 1.7–2.4)

## 2023-04-20 LAB — CBC
HCT: 23.1 % — ABNORMAL LOW (ref 36.0–46.0)
Hemoglobin: 7.7 g/dL — ABNORMAL LOW (ref 12.0–15.0)
MCH: 31.6 pg (ref 26.0–34.0)
MCHC: 33.3 g/dL (ref 30.0–36.0)
MCV: 94.7 fL (ref 80.0–100.0)
Platelets: 247 10*3/uL (ref 150–400)
RBC: 2.44 MIL/uL — ABNORMAL LOW (ref 3.87–5.11)
RDW: 18.3 % — ABNORMAL HIGH (ref 11.5–15.5)
WBC: 7.5 10*3/uL (ref 4.0–10.5)
nRBC: 0 % (ref 0.0–0.2)

## 2023-04-20 LAB — SURGICAL PCR SCREEN
MRSA, PCR: NEGATIVE
Staphylococcus aureus: NEGATIVE

## 2023-04-20 SURGERY — OPEN REDUCTION INTERNAL FIXATION (ORIF) DISTAL FEMUR FRACTURE
Anesthesia: General | Site: Hip | Laterality: Right

## 2023-04-20 MED ORDER — ORAL CARE MOUTH RINSE: 15.0000 mL | Freq: Once | OROMUCOSAL | Status: AC

## 2023-04-20 MED ORDER — ONDANSETRON HCL 4 MG/2ML IJ SOLN: 4.0000 mg | Freq: Four times a day (QID) | INTRAMUSCULAR | Status: DC | PRN

## 2023-04-20 MED ORDER — ROCURONIUM BROMIDE 10 MG/ML (PF) SYRINGE
PREFILLED_SYRINGE | INTRAVENOUS | Status: AC
  Filled 2023-04-20: qty 10

## 2023-04-20 MED ORDER — KETOROLAC TROMETHAMINE 15 MG/ML IJ SOLN: 15.0000 mg | Freq: Four times a day (QID) | INTRAMUSCULAR | Status: DC | PRN

## 2023-04-20 MED ORDER — CEPHALEXIN 500 MG PO CAPS
500.0000 mg | ORAL_CAPSULE | Freq: Every day | ORAL | Status: DC
  Administered 2023-04-21 – 2023-04-22 (×2): 500 mg via ORAL
  Filled 2023-04-20 (×3): qty 1

## 2023-04-20 MED ORDER — ONDANSETRON HCL 4 MG/2ML IJ SOLN
INTRAMUSCULAR | Status: AC
  Filled 2023-04-20: qty 2

## 2023-04-20 MED ORDER — LACTATED RINGERS IV SOLN: INTRAVENOUS | Status: DC

## 2023-04-20 MED ORDER — VANCOMYCIN HCL 1000 MG IV SOLR
INTRAVENOUS | Status: DC | PRN
  Administered 2023-04-20: 1000 mg via TOPICAL

## 2023-04-20 MED ORDER — PROPOFOL 10 MG/ML IV BOLUS
INTRAVENOUS | Status: AC
  Filled 2023-04-20: qty 20

## 2023-04-20 MED ORDER — FENTANYL CITRATE (PF) 250 MCG/5ML IJ SOLN
INTRAMUSCULAR | Status: AC
  Filled 2023-04-20: qty 5

## 2023-04-20 MED ORDER — ONDANSETRON HCL 4 MG/2ML IJ SOLN
INTRAMUSCULAR | Status: DC | PRN
Start: 2023-04-20 — End: 2023-04-20
  Administered 2023-04-20: 4 mg via INTRAVENOUS

## 2023-04-20 MED ORDER — SUGAMMADEX SODIUM 200 MG/2ML IV SOLN
INTRAVENOUS | Status: DC | PRN
  Administered 2023-04-20: 125 mg via INTRAVENOUS

## 2023-04-20 MED ORDER — PHENYLEPHRINE 80 MCG/ML (10ML) SYRINGE FOR IV PUSH (FOR BLOOD PRESSURE SUPPORT)
PREFILLED_SYRINGE | INTRAVENOUS | Status: AC
  Filled 2023-04-20: qty 10

## 2023-04-20 MED ORDER — FENTANYL CITRATE PF 50 MCG/ML IJ SOSY
25.0000 ug | PREFILLED_SYRINGE | INTRAMUSCULAR | Status: DC | PRN
  Administered 2023-04-20 – 2023-04-21 (×3): 25 ug via INTRAVENOUS
  Filled 2023-04-20 (×3): qty 1

## 2023-04-20 MED ORDER — VANCOMYCIN HCL 1000 MG IV SOLR
INTRAVENOUS | Status: AC
  Filled 2023-04-20: qty 20

## 2023-04-20 MED ORDER — CEFAZOLIN SODIUM-DEXTROSE 2-4 GM/100ML-% IV SOLN: 2.0000 g | Freq: Three times a day (TID) | INTRAVENOUS | Status: DC

## 2023-04-20 MED ORDER — PHENYLEPHRINE 80 MCG/ML (10ML) SYRINGE FOR IV PUSH (FOR BLOOD PRESSURE SUPPORT)
PREFILLED_SYRINGE | INTRAVENOUS | Status: DC | PRN
  Administered 2023-04-20 (×4): 80 ug via INTRAVENOUS

## 2023-04-20 MED ORDER — METOCLOPRAMIDE HCL 5 MG/ML IJ SOLN: 5.0000 mg | Freq: Three times a day (TID) | INTRAMUSCULAR | Status: DC | PRN

## 2023-04-20 MED ORDER — CEFAZOLIN SODIUM-DEXTROSE 1-4 GM/50ML-% IV SOLN
1.0000 g | Freq: Once | INTRAVENOUS | Status: AC
  Administered 2023-04-20: 1 g via INTRAVENOUS
  Filled 2023-04-20: qty 50

## 2023-04-20 MED ORDER — DEXAMETHASONE SODIUM PHOSPHATE 10 MG/ML IJ SOLN
INTRAMUSCULAR | Status: DC | PRN
  Administered 2023-04-20: 4 mg via INTRAVENOUS

## 2023-04-20 MED ORDER — DOCUSATE SODIUM 100 MG PO CAPS
100.0000 mg | ORAL_CAPSULE | Freq: Two times a day (BID) | ORAL | Status: DC
  Administered 2023-04-20 – 2023-04-22 (×4): 100 mg via ORAL
  Filled 2023-04-20 (×4): qty 1

## 2023-04-20 MED ORDER — ONDANSETRON HCL 4 MG PO TABS
4.0000 mg | ORAL_TABLET | Freq: Four times a day (QID) | ORAL | Status: DC | PRN
Start: 2023-04-20 — End: 2023-04-22

## 2023-04-20 MED ORDER — SUGAMMADEX SODIUM 200 MG/2ML IV SOLN
INTRAVENOUS | Status: AC
  Filled 2023-04-20: qty 2

## 2023-04-20 MED ORDER — ASPIRIN 81 MG PO TBEC
81.0000 mg | DELAYED_RELEASE_TABLET | Freq: Every day | ORAL | Status: DC
  Administered 2023-04-21 – 2023-04-22 (×2): 81 mg via ORAL
  Filled 2023-04-20 (×2): qty 1

## 2023-04-20 MED ORDER — DEXAMETHASONE SODIUM PHOSPHATE 10 MG/ML IJ SOLN
INTRAMUSCULAR | Status: AC
  Filled 2023-04-20: qty 1

## 2023-04-20 MED ORDER — STERILE WATER FOR IRRIGATION IR SOLN
Status: DC | PRN
  Administered 2023-04-20: 1000 mL

## 2023-04-20 MED ORDER — SODIUM CHLORIDE 0.9% IV SOLUTION: Freq: Once | INTRAVENOUS | Status: DC

## 2023-04-20 MED ORDER — METOCLOPRAMIDE HCL 5 MG PO TABS: 5.0000 mg | ORAL_TABLET | Freq: Three times a day (TID) | ORAL | Status: DC | PRN

## 2023-04-20 MED ORDER — 0.9 % SODIUM CHLORIDE (POUR BTL) OPTIME
TOPICAL | Status: DC | PRN
  Administered 2023-04-20: 1000 mL

## 2023-04-20 MED ORDER — TRAMADOL HCL 50 MG PO TABS
50.0000 mg | ORAL_TABLET | Freq: Two times a day (BID) | ORAL | Status: DC | PRN
  Administered 2023-04-20 – 2023-04-21 (×2): 50 mg via ORAL
  Filled 2023-04-20 (×2): qty 1

## 2023-04-20 MED ORDER — ROCURONIUM BROMIDE 10 MG/ML (PF) SYRINGE
PREFILLED_SYRINGE | INTRAVENOUS | Status: DC | PRN
  Administered 2023-04-20: 40 mg via INTRAVENOUS

## 2023-04-20 MED ORDER — TRAMADOL HCL 50 MG PO TABS: 50.0000 mg | ORAL_TABLET | Freq: Two times a day (BID) | ORAL | Status: DC | PRN

## 2023-04-20 MED ORDER — CHLORHEXIDINE GLUCONATE 0.12 % MT SOLN: 15.0000 mL | Freq: Once | OROMUCOSAL | Status: AC

## 2023-04-20 MED ORDER — CHLORHEXIDINE GLUCONATE 0.12 % MT SOLN
OROMUCOSAL | Status: AC
  Administered 2023-04-20: 15 mL via OROMUCOSAL
  Filled 2023-04-20: qty 15

## 2023-04-20 MED ORDER — CEFAZOLIN SODIUM-DEXTROSE 2-3 GM-%(50ML) IV SOLR
INTRAVENOUS | Status: DC | PRN
Start: 2023-04-20 — End: 2023-04-20
  Administered 2023-04-20: 2 g via INTRAVENOUS

## 2023-04-20 MED ORDER — LIDOCAINE 2% (20 MG/ML) 5 ML SYRINGE
INTRAMUSCULAR | Status: AC
  Filled 2023-04-20: qty 5

## 2023-04-20 MED ORDER — LIDOCAINE 2% (20 MG/ML) 5 ML SYRINGE
INTRAMUSCULAR | Status: DC | PRN
  Administered 2023-04-20: 40 mg via INTRAVENOUS

## 2023-04-20 MED ORDER — SODIUM CHLORIDE 0.9 % IV SOLN: INTRAVENOUS | Status: DC | PRN

## 2023-04-20 MED ORDER — PROPOFOL 10 MG/ML IV BOLUS
INTRAVENOUS | Status: DC | PRN
  Administered 2023-04-20: 20 mg via INTRAVENOUS
  Administered 2023-04-20: 10 mg via INTRAVENOUS
  Administered 2023-04-20: 50 mg via INTRAVENOUS
  Administered 2023-04-20: 10 mg via INTRAVENOUS

## 2023-04-20 MED ORDER — FENTANYL CITRATE (PF) 100 MCG/2ML IJ SOLN
INTRAMUSCULAR | Status: DC | PRN
  Administered 2023-04-20: 25 ug via INTRAVENOUS

## 2023-04-20 SURGICAL SUPPLY — 60 items
BAG COUNTER SPONGE SURGICOUNT (BAG) ×1
BIT DRILL 4.3 (BIT) ×1
BIT DRILL LONG 3.3 (BIT) ×1
BIT DRILL QC 3.3X195 (BIT) ×1
BLADE CLIPPER SURG (BLADE)
BNDG COHESIVE 6X5 TAN ST LF (GAUZE/BANDAGES/DRESSINGS) ×1
BNDG ELASTIC 6X10 VLCR STRL LF (GAUZE/BANDAGES/DRESSINGS)
BRUSH SCRUB EZ PLAIN DRY (MISCELLANEOUS) ×1
CANISTER SUCT 3000ML PPV (MISCELLANEOUS) ×1
CAP LOCK NCB (Cap) ×8 IMPLANT
CHLORAPREP W/TINT 26 (MISCELLANEOUS) ×2
COVER SURGICAL LIGHT HANDLE (MISCELLANEOUS) ×1
DERMABOND ADVANCED .7 DNX12 (GAUZE/BANDAGES/DRESSINGS) ×2
DRAPE C-ARM 42X72 X-RAY (DRAPES) ×1
DRAPE C-ARMOR (DRAPES) ×1
DRAPE HALF SHEET 40X57 (DRAPES) ×1
DRAPE SURG 17X23 STRL (DRAPES) ×1
DRAPE SURG ORHT 6 SPLT 77X108 (DRAPES) ×2
DRAPE U-SHAPE 47X51 STRL (DRAPES) ×1
DRSG ADAPTIC 3X8 NADH LF (GAUZE/BANDAGES/DRESSINGS)
DRSG MEPILEX FLEX 4X4 (GAUZE/BANDAGES/DRESSINGS) ×1 IMPLANT
DRSG MEPILEX POST OP 4X12 (GAUZE/BANDAGES/DRESSINGS)
DRSG MEPILEX POST OP 4X8 (GAUZE/BANDAGES/DRESSINGS) ×2
ELECT REM PT RETURN 9FT ADLT (ELECTROSURGICAL) ×1 IMPLANT
GAUZE PAD ABD 8X10 STRL (GAUZE/BANDAGES/DRESSINGS)
GAUZE SPONGE 4X4 12PLY STRL (GAUZE/BANDAGES/DRESSINGS)
GLOVE BIO SURGEON STRL SZ 6.5 (GLOVE)
GLOVE BIO SURGEON STRL SZ7.5 (GLOVE) ×4
GLOVE BIOGEL PI IND STRL 6.5 (GLOVE)
GLOVE BIOGEL PI IND STRL 7.5 (GLOVE) ×1
GOWN STRL REUS W/ TWL LRG LVL3 (GOWN DISPOSABLE) ×2
K-WIRE FXSTD 280X2XNS SS (WIRE) ×1 IMPLANT
KIT BASIN OR (CUSTOM PROCEDURE TRAY) ×1
KIT TURNOVER KIT B (KITS) ×1
NS IRRIG 1000ML POUR BTL (IV SOLUTION) ×1
PACK TOTAL JOINT (CUSTOM PROCEDURE TRAY) ×1
PAD ARMBOARD 7.5X6 YLW CONV (MISCELLANEOUS) ×1
PAD CAST 4YDX4 CTTN HI CHSV (CAST SUPPLIES)
PADDING CAST COTTON 6X4 STRL (CAST SUPPLIES)
PLATE PROXIMAL FEMUR 12H RT (Plate) ×1 IMPLANT
SCREW 5.0 32MM (Screw) ×1 IMPLANT
SCREW CORT NCB SELFTAP 5.0X50 (Screw) ×2 IMPLANT
SCREW CORTICAL NCB 5.0X40 (Screw) ×1 IMPLANT
SCREW CORTICAL NCB 5.0X44 (Screw) ×1 IMPLANT
SCREW NCB 4.0 32MM (Screw) ×1 IMPLANT
SCREW NCB 4.0MX44M (Screw) ×1 IMPLANT
SCREW NCB 4.0MX50M (Screw) ×1 IMPLANT
SCREW NCB 4.0X36MM (Screw) ×1 IMPLANT
SPONGE T-LAP 18X18 ~~LOC~~+RFID (SPONGE)
STAPLER VISISTAT 35W (STAPLE)
SUCTION TUBE FRAZIER 10FR DISP (SUCTIONS)
SUT ETHILON 3 0 PS 1 (SUTURE)
SUT MNCRL AB 3-0 PS2 27 (SUTURE) ×1
SUT MON AB 2-0 CT1 36 (SUTURE) ×1
SUT VIC AB 0 CT1 27XBRD ANBCTR (SUTURE) ×1
SUT VIC AB 1 CT1 27XBRD ANBCTR (SUTURE)
SUT VIC AB 2-0 CT1 TAPERPNT 27 (SUTURE)
TOWEL GREEN STERILE (TOWEL DISPOSABLE) ×1
TRAY FOLEY MTR SLVR 16FR STAT (SET/KITS/TRAYS/PACK)
WATER STERILE IRR 1000ML POUR (IV SOLUTION) ×2

## 2023-04-20 NOTE — Progress Notes (Signed)
 PHARMACY NOTE:  ANTIMICROBIAL RENAL DOSAGE ADJUSTMENT  Current antimicrobial regimen includes a mismatch between antimicrobial dosage and estimated renal function.  As per policy approved by the Pharmacy & Therapeutics and Medical Executive Committees, the antimicrobial dosage will be adjusted accordingly.  Current antimicrobial dosage:  ancef 2gm IV q8h x3 doses  Indication: Surgical prophylaxis  Renal Function:  Estimated Creatinine Clearance: 5.6 mL/min (A) (by C-G formula based on SCr of 5.56 mg/dL (H)). []      On intermittent HD, scheduled: []      On CRRT    Antimicrobial dosage has been changed to:   Ancef 1gm IV x1   Additional comments:With renal function (and ancef 2gm given ~ 1:30pm today), Ancef 1gm IV x1 will cover for 24 hrs   Thank you for allowing pharmacy to be a part of this patient's care.  Harland German, PharmD Clinical Pharmacist **Pharmacist phone directory can now be found on [email or web address] ([email or web address]).  Listed under [facility].

## 2023-04-20 NOTE — Op Note (Signed)
 Orthopaedic Surgery Operative Note (CSN: [id number] ) Date of Surgery: 04/20/2023  Admit Date: 04/18/2023   Diagnoses: Pre-Op Diagnoses: Right periprosthetic femoral shaft fracture  Post-Op Diagnosis: Same  Procedures: CPT 27507-Open reduction internal fixation of right femur fracture  Surgeons : Primary: Roby Lofts, MD  Assistant: None  Location: OR 3   Anesthesia: General   Antibiotics: Ancef 2g preop with 1 gm vancomycin powder placed topically   Tourniquet time: None    Estimated Blood Loss: 150 mL  Complications: Skin tear to left elbow during transfer to OR table  Specimens:* No specimens in log *   Implants: Implant Name Type Inv. Item Serial No. Manufacturer Lot No. LRB No. Used Action  CAP LOCK NCB - [id number] Cap CAP LOCK NCB  [employer](ORTH,TRAU,BIO,SG)  Right 8 Implanted  PLATE PROXIMAL FEMUR 12H RT - [id number] Plate PLATE PROXIMAL FEMUR 12H RT  [employer](ORTH,TRAU,BIO,SG)  Right 1 Implanted  SCREW NCB 4.0 - [id number] Screw SCREW NCB 4.0  [employer](ORTH,TRAU,BIO,SG)  Right 1 Implanted  SCREW CORT NCB SELFTAP 5.0X50 - [id number] Screw SCREW CORT NCB SELFTAP 5.0X50  [employer](ORTH,TRAU,BIO,SG)  Right 2 Implanted  SCREW CORTICAL NCB 5.0X40 - [id number] Screw SCREW CORTICAL NCB 5.0X40  [employer](ORTH,TRAU,BIO,SG)  Right 1 Implanted  SCREW CORTICAL NCB 5.0X44 - [id number] Screw SCREW CORTICAL NCB 5.0X44  [employer](ORTH,TRAU,BIO,SG)  Right 1 Implanted     Indications for Surgery: 89 year old female who sustained a right femoral shaft fracture.  She is on hospice and has dementia but was in significant amount of pain and with the gross deformity of her leg with severe pain that she was having a discussed with her and her son about proceeding with open reduction internal fixation.  Risks included bleeding, infection, malunion, nonunion, hardware failure, hardware irritation, nerve and blood vessel injury, periprosthetic fracture around  the hardware, DVT, even the possibility anesthetic complications.  The son agreed to proceed with surgery and consent was obtained.  Operative Findings: Open reduction internal fixation of right femoral shaft fracture using [employer] NCB proximal femoral locking plate.  Difficulty in visualization of the fracture and femur secondary to her contracted nature of her hip and knee.  Procedure: The patient was identified in the preoperative holding area. Consent was confirmed with the patient and their family and all questions were answered. The operative extremity was marked after confirmation with the patient. she was then brought back to the operating room by our anesthesia colleagues.  She is placed under general anesthetic after an attempted spinal anesthetic was unsuccessful.  During transfer to the OR table there was a skin tear of her left elbow from manipulating her body.  This was dressed and the patient was then prepped and draped in usual sterile fashion.  A timeout was performed to verify the patient, the procedure, and the extremity.  Preoperative antibiotics were dosed.  Fluoroscopic imaging showed the unstable nature of her injury.  I made a decision to plate instead of placing the nail or removing any of the hardware.  Incision was made proximal just below the previous cannulated screws.  I incised through the IT band and mobilized the vastus to expose the lateral cortex of the femur.  Traction was applied by my assistant and manipulation of the fracture was obtained to align appropriately.  I then chose a 12 hole [employer] proximal femoral locking plate and attached this to a targeting arm and slid it submuscularly along the lateral cortex of the femur.  I placed a guidewire in the proximal segment and 3.3 mm virtual bit distally to aligned the distal portion of the plate.  I then drilled and placed nonlocking screw proximally as well as percutaneous 4.0 and 5.0 millimeter screws into  the femoral shaft distally to align the coronal portion of the fracture.  I placed locking caps on the femoral shaft screws.  I then removed the targeting arm and proceeded to drill and place screws into the proximal segment.  Decent fixation was obtained even with her hostile parotic bone.  Locking caps were placed on the proximal screws.  Final fluoroscopic imaging was obtained.  The incisions were irrigated and a gram of vancomycin powder was placed into the incision.  A layered closure of 0 Vicryl, 2-0 Monocryl and Dermabond was used to close the skin.  Sterile dressings were applied.  The patient was then awoken from anesthesia and taken to the PACU in stable condition.  Post Op Plan/Instructions: The patient will be nonweightbearing to the right lower extremity she will receive postoperative Ancef.  She will be placed on aspirin for DVT prophylaxis.  We will have her mobilize with physical and Occupational Therapy as needed.  I was present and performed the entire surgery.  Truitt Merle, MD [facility]

## 2023-04-20 NOTE — Progress Notes (Signed)
 Report given to OR staff. Patient's teeth brushed and CBG bath done. Transport at bedside ready to transport patient

## 2023-04-20 NOTE — Transfer of Care (Signed)
 Immediate Anesthesia Transfer of Care Note  Patient: Joanne Shaw  Procedure(s) Performed: OPEN REDUCTION INTERNAL FIXATION (ORIF) DISTAL FEMUR FRACTURE (Right: Hip)  Patient Location: PACU  Anesthesia Type:General  Level of Consciousness: drowsy  Airway & Oxygen Therapy: Patient Spontanous Breathing  Post-op Assessment: Report given to RN and Post -op Vital signs reviewed and stable  Post vital signs: Reviewed and stable  Last Vitals:  Vitals Value Taken Time  BP 163/69 04/20/23 1443  Temp    Pulse 64 04/20/23 1444  Resp 15 04/20/23 1444  SpO2 95 % 04/20/23 1444  Vitals shown include unfiled device data.  Last Pain:  Vitals:   04/20/23 1052  TempSrc: Oral  PainSc:          Complications: No notable events documented.

## 2023-04-20 NOTE — Progress Notes (Signed)
 PROGRESS NOTE    Joanne Shaw  ZOX:[id number] DOB: August 15, 1930 DOA: 04/18/2023 PCP: Christene Lye, FNP   Brief Narrative: Joanne Shaw is a 89 y.o. female with a history of hypertension, hyperlipidemia, hypothyroidism.  Patient presented secondary to right leg pain after a fall and found to have a displaced right femoral shaft fracture. Orthopedic surgery consulted with plan for surgical repair.   Assessment and Plan:  Displaced right femoral shaft fracture Secondary to fall. Orthopedic surgery consulted with plan for surgical repair.  Acute blood loss anemia Chronic anemia Acute blood loss related to femur fracture. Chronic anemia likely related to chronic illness. Patient's baseline hemoglobin is about 7-8 g/dL. Hemoglobin of 7.6 g/dL on admission with low of 6.8 g/dL requiring a transfusion of 1 unit of PRBC. Post-transfusion hemoglobin of 7.7 g/dL.  AKI on CKD stage IIIb Baseline creatinine of 2.6-2.8. Creatinine up to 5.70 on admission. Associated BUN of 77. Not a candidate for hemodialysis. BUN/creatinine downtrending slightly.  Cellulitis Diagnosed outpatient. Patient managed on Keflex BID as an outpatient.  Hypokalemia Mild-moderately low down to 2.9. Resolved with potassium supplementation  Primary hypertension -Continue hydralazine and Toprol-XL  Hyperlipidemia Noted. Patient not on lipid lowering medication. Unlikely helpful with current goals of care.  Hypothyroidism -Continue levothyroxine 100 mcg daily  GERD -Continue Protonix  Dementia -Continue delirium precautions  Severe malnutrition Noted.  Hx of right breast cancer Patient is currently enrolled in hospice care.     DVT prophylaxis: SCDs Code Status:   Code Status: Limited: Do not attempt resuscitation (DNR) -DNR-LIMITED -Do Not Intubate/DNI  Family Communication: None at bedside Disposition Plan: Discharge pending orthopedic surgery recommendations/management   Consultants:   Orthopedic surgery Palliative care medicine  Procedures:  None  Antimicrobials: Keflex    Subjective: Patient reports right leg pain. No other concerns at this time.  Objective: BP (!) 156/70 (BP Location: Right Arm)   Pulse 85   Temp 98.4 F (36.9 C) (Oral)   Resp 16   Ht 5\' 3"  (1.6 m)   Wt 54.8 kg   SpO2 95%   BMI 21.40 kg/m   Examination:  General exam: Appears calm and comfortable at rest. Respiratory system: Clear to auscultation. Respiratory effort normal. Cardiovascular system: S1 & S2 heard, RRR. No murmurs. Gastrointestinal system: Abdomen is nondistended, soft and nontender. Normal bowel sounds heard. Central nervous system: Alert.   Data Reviewed: I have personally reviewed following labs and imaging studies  CBC Lab Results  Component Value Date   WBC 7.5 04/20/2023   RBC 2.44 (L) 04/20/2023   HGB 7.7 (L) 04/20/2023   HCT 23.1 (L) 04/20/2023   MCV 94.7 04/20/2023   MCH 31.6 04/20/2023   PLT 247 04/20/2023   MCHC 33.3 04/20/2023   RDW 18.3 (H) 04/20/2023   LYMPHSABS 1.7 04/18/2023   MONOABS 0.5 04/18/2023   EOSABS 0.0 04/18/2023   BASOSABS 0.0 04/18/2023     Last metabolic panel Lab Results  Component Value Date   NA 137 04/20/2023   K 4.0 04/20/2023   CL 107 04/20/2023   CO2 16 (L) 04/20/2023   BUN 72 (H) 04/20/2023   CREATININE 5.56 (H) 04/20/2023   GLUCOSE 78 04/20/2023   GFRNONAA 7 (L) 04/20/2023   CALCIUM 8.3 (L) 04/20/2023   PHOS 3.6 12/10/2022   PROT 5.8 (L) 04/19/2023   ALBUMIN 2.4 (L) 04/19/2023   BILITOT 0.6 04/19/2023   ALKPHOS 87 04/19/2023   AST 96 (H) 04/19/2023   ALT 70 (H) 04/19/2023   ANIONGAP  14 04/20/2023    GFR: Estimated Creatinine Clearance: 5.6 mL/min (A) (by C-G formula based on SCr of 5.56 mg/dL (H)).  No results found for this or any previous visit (from the past 240 hours).    Radiology Studies: DG Chest Portable 1 View Result Date: 04/18/2023 CLINICAL DATA:  Right femur fracture. EXAM:  PORTABLE CHEST 1 VIEW COMPARISON:  Chest radiograph 12/09/2022 FINDINGS: Chronic cardiomegaly. Unchanged mediastinal contours. Aortic atherosclerosis. Chronic interstitial coarsening without focal airspace disease. No pleural fluid or pneumothorax. No acute osseous findings. Surgical clips project over the right hemithorax. IMPRESSION: 1. No acute findings. 2. Chronic interstitial coarsening. Electronically Signed   By: Narda Rutherford M.D.   On: 04/18/2023 21:30   DG Femur Portable Min 2 Views Right Result Date: 04/18/2023 CLINICAL DATA:  Deformity.  Right hip pain. EXAM: RIGHT FEMUR PORTABLE 2 VIEW COMPARISON:  None Available. FINDINGS: The bones are diffusely under mineralized. There is displaced fracture of the proximal femoral shaft. Osseous overriding of approximately 4 cm. Previous pinning of the right femoral neck. Advanced right hip arthropathy. The distal femur is intact. Knee alignment is maintained. IMPRESSION: Displaced proximal femoral shaft fracture with osseous overriding. Electronically Signed   By: Narda Rutherford M.D.   On: 04/18/2023 21:28      LOS: 2 days    Jacquelin Hawking, MD [facility] 04/20/2023, 7:55 AM   If 7PM-7AM, please contact night-coverage [email or web address]

## 2023-04-20 NOTE — Anesthesia Postprocedure Evaluation (Signed)
 Anesthesia Post Note  Patient: Assyria Morreale  Procedure(s) Performed: OPEN REDUCTION INTERNAL FIXATION (ORIF) DISTAL FEMUR FRACTURE (Right: Hip)     Patient location during evaluation: PACU Anesthesia Type: General Level of consciousness: awake and alert Pain management: pain level controlled Vital Signs Assessment: post-procedure vital signs reviewed and stable Respiratory status: spontaneous breathing, nonlabored ventilation and respiratory function stable Cardiovascular status: blood pressure returned to baseline and stable Postop Assessment: no apparent nausea or vomiting Anesthetic complications: no  No notable events documented.  Last Vitals:  Vitals:   04/20/23 1515 04/20/23 1542  BP: (!) 182/61 (!) 164/64  Pulse: 60 100  Resp: 16 16  Temp: 36.5 C 36.4 C  SpO2: 93% 100%    Last Pain:  Vitals:   04/20/23 1542  TempSrc: Oral  PainSc:                  Manjot Hinks,W. EDMOND

## 2023-04-20 NOTE — Hospital Course (Signed)
 Joanne Shaw is a 89 y.o. female with a history of hypertension, hyperlipidemia, hypothyroidism.  Patient presented secondary to right leg pain after a fall and found to have a displaced right femoral shaft fracture. Orthopedic surgery consulted with plan for surgical repair.

## 2023-04-20 NOTE — Progress Notes (Signed)
 [name] Patient Note    Shandrell Boda is a current hospice patient with a terminal diagnosis of End Stage Renal Disease. Facility notified [facility] that patient had been screaming and in pain whrn right leg touched. States that patient sustained a fall prior. Nurse came and evaluated patient and she appeared to be in severe pain. Leg looked deformed. Attempted to have mobile x-ray, however patient unable to hold still to have this completed. Staff notified family and it was decided to activate EMS and have patient evaluated at hospital. Patient was admitted to [facility] on 2.14. 25 with a diagnosis of right femoral shaft fracture.  Per Dr. Barbee Shropshire with [facility], this is a related hospital admission.   Visited patient at bedside this morning. She appeared to be in good spirits. Scheduled for surgery on today for repair of right femur fracture. She remains inpatient appropriate with need for surgical intervention due to fracture.   Vital Signs: 98.0/63/15    189/74     100% on ra   I&O 1115/450   Abnormal labs:  04/20/23 04:18 CO2: 16 (L) BUN: 72 (H) Creatinine: 5.56 (H) Calcium: 8.3 (L) Magnesium: 1.4 (L) GFR, Estimated: 7 (L) RBC: 2.44 (L) Hemoglobin: 7.7 (L) HCT: 23.1 (L) RDW: 18.3 (H)   Diagnostics: none new     IV/PRN Meds: Toradol 15mg  IV x1   Problem list as per Jacquelin Hawking, MD 2.16.2025  Assessment and Plan:   Displaced right femoral shaft fracture Secondary to fall. Orthopedic surgery consulted with plan for surgical repair.   Acute blood loss anemia Chronic anemia Acute blood loss related to femur fracture. Chronic anemia likely related to chronic illness. Patient's baseline hemoglobin is about 7-8 g/dL. Hemoglobin of 7.6 g/dL on admission with low of 6.8 g/dL requiring a transfusion of 1 unit of PRBC. Post-transfusion hemoglobin of 7.7 g/dL.   AKI on CKD stage IIIb Baseline creatinine of 2.6-2.8.  Creatinine up to 5.70 on admission. Associated BUN of 77. Not a candidate for hemodialysis. BUN/creatinine downtrending slightly.   Cellulitis Diagnosed outpatient. Patient managed on Keflex BID as an outpatient.   Hypokalemia Mild-moderately low down to 2.9. Resolved with potassium supplementation   Primary hypertension -Continue hydralazine and Toprol-XL   Hyperlipidemia Noted. Patient not on lipid lowering medication. Unlikely helpful with current goals of care.   Hypothyroidism -Continue levothyroxine 100 mcg daily   GERD -Continue Protonix   Dementia -Continue delirium precautions   Severe malnutrition Noted.   Hx of right breast cancer Patient is currently enrolled in hospice care.   Discharge Planning: Ongoing   Family contact: Spoke with son [name] about surgery   IDT: Updated   Goals of Care:  DNR-Limited   Should patient need ambulance transfer at discharge- please use [facility]) as they contract this service for our active hospice patients.    Roe Rutherford, BSN, RN Hospice Nurse Liaison [phone]

## 2023-04-20 NOTE — Progress Notes (Signed)
 Brief Palliative Medicine Progress Note:  PMT following to assist with ongoing support and GOC. Plan is for orthopedic surgical intervention today. PMT will plan to follow up with patient and family tomorrow 2/17.  Thank you for allowing PMT to assist in the care of this patient.  Zakiyyah Savannah M. Katrinka Blazing [facility] Palliative Medicine Team Team Phone: [phone] NO CHARGE

## 2023-04-20 NOTE — TOC CAGE-AID Note (Signed)
 Transition of Care [facility]) - CAGE-AID Screening  Patient Details  Name: Joanne Shaw MRN: [id number] Date of Birth: [birth year]  Clinical Narrative:  Patient with baseline dementia, oriented only x2. Patient unable to participate in substance abuse screening.  CAGE-AID Screening: Substance Abuse Screening unable to be completed due to: : Patient unable to participate

## 2023-04-20 NOTE — Anesthesia Preprocedure Evaluation (Addendum)
 Anesthesia Evaluation  Patient identified by MRN, date of birth, ID band Patient confused    Reviewed: Allergy & Precautions, H&P , NPO status , Patient's Chart, lab work & pertinent test results, reviewed documented beta blocker date and time   Airway Mallampati: III  TM Distance: >3 FB Neck ROM: Full    Dental no notable dental hx. (+) Teeth Intact, Dental Advisory Given   Pulmonary former smoker   Pulmonary exam normal breath sounds clear to auscultation       Cardiovascular hypertension, Pt. on medications and Pt. on home beta blockers  Rhythm:Regular Rate:Normal     Neuro/Psych  Headaches  Anxiety    Dementia    GI/Hepatic negative GI ROS, Neg liver ROS,,,  Endo/Other  Hypothyroidism    Renal/GU Renal InsufficiencyRenal disease  negative genitourinary   Musculoskeletal  (+) Arthritis , Osteoarthritis,    Abdominal   Peds  Hematology  (+) Blood dyscrasia, anemia   Anesthesia Other Findings   Reproductive/Obstetrics negative OB ROS                             Anesthesia Physical Anesthesia Plan  ASA: 3  Anesthesia Plan: Spinal   Post-op Pain Management:    Induction: Intravenous  PONV Risk Score and Plan: 3 and Ondansetron and Propofol infusion  Airway Management Planned: Natural Airway and Simple Face Mask  Additional Equipment:   Intra-op Plan:   Post-operative Plan:   Informed Consent: I have reviewed the patients History and Physical, chart, labs and discussed the procedure including the risks, benefits and alternatives for the proposed anesthesia with the patient or authorized representative who has indicated his/her understanding and acceptance.   Patient has DNR.  Discussed DNR with power of attorney and Continue DNR.   Dental advisory given  Plan Discussed with: CRNA  Anesthesia Plan Comments:        Anesthesia Quick Evaluation

## 2023-04-20 NOTE — Interval H&P Note (Signed)
 History and Physical Interval Note:  04/20/2023 12:10 PM  Joanne Shaw  has presented today for surgery, with the diagnosis of Right Hip Fracture.  The various methods of treatment have been discussed with the patient and family. After consideration of risks, benefits and other options for treatment, the patient has consented to  Procedure(s): OPEN REDUCTION INTERNAL FIXATION (ORIF) DISTAL FEMUR FRACTURE (Right) as a surgical intervention.  The patient's history has been reviewed, patient examined, no change in status, stable for surgery.  I have reviewed the patient's chart and labs.  Questions were answered to the patient's satisfaction.     Caryn Bee P Woodroe Vogan

## 2023-04-20 NOTE — Consult Note (Signed)
 Orthopaedic Trauma Service (OTS) Consult   Patient ID: Joanne Shaw MRN: [id number] DOB/AGE: August 12, 1930 89 y.o.  Reason for Consult:Right femur fracture Referring Physician: Dr. Toni Arthurs, MD Raechel Chute  HPI: Joanne Shaw is an 89 y.o. female who is being seen in consultation at the request of Dr. Victorino Dike for evaluation of right femur fracture.  Patient was at her facility when they noticed that her leg was deformed and having severe pain with any movement.  She brought her to the emergency room where x-rays showed a right femur fracture.  Due to the previous hardware in her hip Dr. Victorino Dike felt that this required treatment by an orthopedic traumatologist.  She was subsequently transferred over [facility] and I evaluate her today.  No bone is at bedside but the son I was contacted over the phone.  He states that she does use a wheelchair and transfers some but mostly does not ambulate.  In talking with the patient she is having severe pain and she is not able to move secondary to the pain.  Past Medical History:  Diagnosis Date   Cancer (HCC) 01/02/2010   right breast (Dr Kathaleen Grinder)   Chronic arthritis    Hyperlipidemia    Hypertension    Hypothyroidism    Occipital neuralgia    30 yrs ago   Osteopenia    Right knee DJD    Senile dementia [facility])     Past Surgical History:  Procedure Laterality Date   APPENDECTOMY     BREAST SURGERY  2-12   right breast segmental mastectomy- Dr Lafayette Dragon   TUBAL LIGATION      History reviewed. No pertinent family history.  Social History:  reports that she quit smoking about 25 years ago. Her smoking use included cigarettes. She does not have any smokeless tobacco history on file. She reports current alcohol use of about 7.0 standard drinks of alcohol per week. She reports that she does not use drugs.  Allergies:  Allergies  Allergen Reactions   Amlodipine Other (See Comments)    Reaction not listed on MAR    Codeine Sulfate  Nausea And Vomiting   Demerol [Meperidine] Nausea Only   Lisinopril Other (See Comments)    Reaction not listed on MAR    Oxycodone Other (See Comments)    Reaction not listed on MAR    Penicillins Swelling    Allergy not listed on MAR     Medications:  No current facility-administered medications on file prior to encounter.   Current Outpatient Medications on File Prior to Encounter  Medication Sig Dispense Refill   cephALEXin (KEFLEX) 500 MG capsule Take 2 capsules (1,000 mg total) by mouth 2 (two) times daily for 7 days. 28 capsule 0   gabapentin (NEURONTIN) 100 MG capsule Take 100 mg by mouth 2 (two) times daily.     hydrALAZINE (APRESOLINE) 25 MG tablet Take 25 mg by mouth 3 (three) times daily.     levothyroxine (SYNTHROID) 100 MCG tablet Take 100 mcg by mouth daily before breakfast.     metoprolol succinate (TOPROL-XL) 50 MG 24 hr tablet TAKE 1 TABLET BY MOUTH EVERY DAY 30 tablet 0   pantoprazole (PROTONIX) 40 MG tablet Take 40 mg by mouth every evening.     polyethylene glycol (MIRALAX / GLYCOLAX) 17 g packet Take 17 g by mouth daily.     senna (SENOKOT) 8.6 MG TABS tablet Take 17.2 mg by mouth daily as needed for mild constipation or moderate  constipation.     sertraline (ZOLOFT) 50 MG tablet Take 50 mg by mouth daily.     torsemide (DEMADEX) 10 MG tablet Take 10 mg by mouth 2 (two) times daily.     traMADol HCl 25 MG TABS Take 1 tablet by mouth in the morning and at bedtime.     ferrous sulfate 325 (65 FE) MG EC tablet Take 325 mg by mouth every other day.       ROS: Unable to obtain secondary to dementia  Exam: Blood pressure (!) 156/70, pulse 85, temperature 98.4 F (36.9 C), temperature source Oral, resp. rate 16, height 5\' 3"  (1.6 m), weight 54.8 kg, SpO2 95%. General: No distress lying in bed ill-appearing Orientation: Oriented to person and place Mood and Affect: Cooperative and pleasant Gait: Unable to ambulate Coordination and balance: Within normal  limits  Right lower extremity: Patient is contracted in a fetal position with gross deformity through her leg.  Any movement of that leg causes severe pain.  She is able to wiggle her toes and does endorse sensation but otherwise cannot cooperate with motor exam.  Left lower extremity: I did not manipulate the leg secondary to the pain of her right femur.  She has no obvious wounds or deformities through that left leg.  Skin without lesions. No tenderness to palpation. Full painless ROM, full strength in each muscle groups without evidence of instability.   Medical Decision Making: Data: Imaging: X-rays show proximal femur fracture below previous cannulated screws with avascular process and complete collapse of the right hip.  Labs:  Results for orders placed or performed during the hospital encounter of 04/18/23 (from the past 24 hours)  Magnesium     Status: Abnormal   Collection Time: 04/19/23  5:14 PM  Result Value Ref Range   Magnesium 1.5 (L) 1.7 - 2.4 mg/dL  Vitamin X91     Status: Abnormal   Collection Time: 04/19/23  5:14 PM  Result Value Ref Range   Vitamin B-12 1,903 (H) 180 - 914 pg/mL  Folate     Status: None   Collection Time: 04/19/23  5:14 PM  Result Value Ref Range   Folate 14.6 >5.9 ng/mL  Iron and TIBC     Status: Abnormal   Collection Time: 04/19/23  5:14 PM  Result Value Ref Range   Iron 123 28 - 170 ug/dL   TIBC 478 (L) 295 - 621 ug/dL   Saturation Ratios 68 (H) 10.4 - 31.8 %   UIBC 59 ug/dL  Ferritin     Status: Abnormal   Collection Time: 04/19/23  5:14 PM  Result Value Ref Range   Ferritin 1,108 (H) 11 - 307 ng/mL  CBC     Status: Abnormal   Collection Time: 04/20/23  4:18 AM  Result Value Ref Range   WBC 7.5 4.0 - 10.5 K/uL   RBC 2.44 (L) 3.87 - 5.11 MIL/uL   Hemoglobin 7.7 (L) 12.0 - 15.0 g/dL   HCT 30.8 (L) 65.7 - 84.6 %   MCV 94.7 80.0 - 100.0 fL   MCH 31.6 26.0 - 34.0 pg   MCHC 33.3 30.0 - 36.0 g/dL   RDW 96.2 (H) 95.2 - 84.1 %   Platelets  247 150 - 400 K/uL   nRBC 0.0 0.0 - 0.2 %  Basic metabolic panel     Status: Abnormal   Collection Time: 04/20/23  4:18 AM  Result Value Ref Range   Sodium 137 135 - 145 mmol/L  Potassium 4.0 3.5 - 5.1 mmol/L   Chloride 107 98 - 111 mmol/L   CO2 16 (L) 22 - 32 mmol/L   Glucose, Bld 78 70 - 99 mg/dL   BUN 72 (H) 8 - 23 mg/dL   Creatinine, Ser 1.61 (H) 0.44 - 1.00 mg/dL   Calcium 8.3 (L) 8.9 - 10.3 mg/dL   GFR, Estimated 7 (L) >60 mL/min   Anion gap 14 5 - 15  Magnesium     Status: Abnormal   Collection Time: 04/20/23  4:18 AM  Result Value Ref Range   Magnesium 1.4 (L) 1.7 - 2.4 mg/dL  Surgical pcr screen     Status: None   Collection Time: 04/20/23  6:17 AM   Specimen: Nasal Mucosa; Nasal Swab  Result Value Ref Range   MRSA, PCR NEGATIVE NEGATIVE   Staphylococcus aureus NEGATIVE NEGATIVE  Type and screen [facility]     Status: None (Preliminary result)   Collection Time: 04/20/23  8:46 AM  Result Value Ref Range   ABO/RH(D) PENDING    Antibody Screen PENDING    Sample Expiration      04/23/2023,2359 Performed at [facility] [place] [facility], [address] [place], [place] [address]      Imaging or Labs ordered: None  Medical history and chart was reviewed and case discussed with medical provider.  Assessment/Plan: 89 year old female with end-stage renal disease on hospice with mild dementia with right proximal femur fracture.  I discussed the patient's predicament with both her as well as her son over the phone.  The patient is in severe pain and cannot mobilize or move without severe pain in her leg.  Without surgical fixation I feel that she will not be able to mobilize and will spend the rest of her life in bed.  The patient is aware of what is going on for the most part and she does not wish to be bedbound for the rest of her life.  The son does not wish this either.  They were concerned about her elevated creatinine but her potassium is within normal limits.   Her hemoglobin has come up to 7.6 this morning.  I discussed other risks of the procedure with the son and this included bleeding, infection, malunion, nonunion, hardware failure, hardware irritation, nerve or blood vessel injury, even the possible anesthetic complications.  He understands and wishes to proceed.  Will plan to try to proceed later today.  Roby Lofts, MD [facility] [phone] (office) [email or web address]

## 2023-04-20 NOTE — Anesthesia Procedure Notes (Signed)
 Procedure Name: Intubation Date/Time: 04/20/2023 1:17 PM  Performed by: Garfield Cornea, CRNAPre-anesthesia Checklist: Patient identified, Emergency Drugs available, Suction available and Patient being monitored Patient Re-evaluated:Patient Re-evaluated prior to induction Oxygen Delivery Method: Circle System Utilized Preoxygenation: Pre-oxygenation with 100% oxygen Induction Type: IV induction Ventilation: Mask ventilation without difficulty Laryngoscope Size: Mac and 3 Grade View: Grade I Tube type: Oral Tube size: 6.5 mm Number of attempts: 1 Airway Equipment and Method: Stylet Placement Confirmation: ETT inserted through vocal cords under direct vision, positive ETCO2 and breath sounds checked- equal and bilateral Secured at: 21 cm Tube secured with: Tape Dental Injury: Teeth and Oropharynx as per pre-operative assessment

## 2023-04-20 NOTE — H&P (View-Only) (Signed)
 Orthopaedic Trauma Service (OTS) Consult   Patient ID: Joanne Shaw MRN: [id number] DOB/AGE: August 12, 1930 89 y.o.  Reason for Consult:Right femur fracture Referring Physician: Dr. Toni Arthurs, MD Raechel Chute  HPI: Joanne Shaw is an 89 y.o. female who is being seen in consultation at the request of Dr. Victorino Dike for evaluation of right femur fracture.  Patient was at her facility when they noticed that her leg was deformed and having severe pain with any movement.  She brought her to the emergency room where x-rays showed a right femur fracture.  Due to the previous hardware in her hip Dr. Victorino Dike felt that this required treatment by an orthopedic traumatologist.  She was subsequently transferred over from [facility] to [facility] and I evaluate her today.  No bone is at bedside but the son I was contacted over the phone.  He states that she does use a wheelchair and transfers some but mostly does not ambulate.  In talking with the patient she is having severe pain and she is not able to move secondary to the pain.  Past Medical History:  Diagnosis Date   Cancer (HCC) 01/02/2010   right breast (Dr Kathaleen Grinder)   Chronic arthritis    Hyperlipidemia    Hypertension    Hypothyroidism    Occipital neuralgia    30 yrs ago   Osteopenia    Right knee DJD    Senile dementia [facility])     Past Surgical History:  Procedure Laterality Date   APPENDECTOMY     BREAST SURGERY  2-12   right breast segmental mastectomy- Dr Lafayette Dragon   TUBAL LIGATION      History reviewed. No pertinent family history.  Social History:  reports that she quit smoking about 25 years ago. Her smoking use included cigarettes. She does not have any smokeless tobacco history on file. She reports current alcohol use of about 7.0 standard drinks of alcohol per week. She reports that she does not use drugs.  Allergies:  Allergies  Allergen Reactions   Amlodipine Other (See Comments)    Reaction not listed on MAR    Codeine Sulfate  Nausea And Vomiting   Demerol [Meperidine] Nausea Only   Lisinopril Other (See Comments)    Reaction not listed on MAR    Oxycodone Other (See Comments)    Reaction not listed on MAR    Penicillins Swelling    Allergy not listed on MAR     Medications:  No current facility-administered medications on file prior to encounter.   Current Outpatient Medications on File Prior to Encounter  Medication Sig Dispense Refill   cephALEXin (KEFLEX) 500 MG capsule Take 2 capsules (1,000 mg total) by mouth 2 (two) times daily for 7 days. 28 capsule 0   gabapentin (NEURONTIN) 100 MG capsule Take 100 mg by mouth 2 (two) times daily.     hydrALAZINE (APRESOLINE) 25 MG tablet Take 25 mg by mouth 3 (three) times daily.     levothyroxine (SYNTHROID) 100 MCG tablet Take 100 mcg by mouth daily before breakfast.     metoprolol succinate (TOPROL-XL) 50 MG 24 hr tablet TAKE 1 TABLET BY MOUTH EVERY DAY 30 tablet 0   pantoprazole (PROTONIX) 40 MG tablet Take 40 mg by mouth every evening.     polyethylene glycol (MIRALAX / GLYCOLAX) 17 g packet Take 17 g by mouth daily.     senna (SENOKOT) 8.6 MG TABS tablet Take 17.2 mg by mouth daily as needed for mild constipation or moderate  constipation.     sertraline (ZOLOFT) 50 MG tablet Take 50 mg by mouth daily.     torsemide (DEMADEX) 10 MG tablet Take 10 mg by mouth 2 (two) times daily.     traMADol HCl 25 MG TABS Take 1 tablet by mouth in the morning and at bedtime.     ferrous sulfate 325 (65 FE) MG EC tablet Take 325 mg by mouth every other day.       ROS: Unable to obtain secondary to dementia  Exam: Blood pressure (!) 156/70, pulse 85, temperature 98.4 F (36.9 C), temperature source Oral, resp. rate 16, height 5\' 3"  (1.6 m), weight 54.8 kg, SpO2 95%. General: No distress lying in bed ill-appearing Orientation: Oriented to person and place Mood and Affect: Cooperative and pleasant Gait: Unable to ambulate Coordination and balance: Within normal  limits  Right lower extremity: Patient is contracted in a fetal position with gross deformity through her leg.  Any movement of that leg causes severe pain.  She is able to wiggle her toes and does endorse sensation but otherwise cannot cooperate with motor exam.  Left lower extremity: I did not manipulate the leg secondary to the pain of her right femur.  She has no obvious wounds or deformities through that left leg.  Skin without lesions. No tenderness to palpation. Full painless ROM, full strength in each muscle groups without evidence of instability.   Medical Decision Making: Data: Imaging: X-rays show proximal femur fracture below previous cannulated screws with avascular process and complete collapse of the right hip.  Labs:  Results for orders placed or performed during the hospital encounter of 04/18/23 (from the past 24 hours)  Magnesium     Status: Abnormal   Collection Time: 04/19/23  5:14 PM  Result Value Ref Range   Magnesium 1.5 (L) 1.7 - 2.4 mg/dL  Vitamin X91     Status: Abnormal   Collection Time: 04/19/23  5:14 PM  Result Value Ref Range   Vitamin B-12 1,903 (H) 180 - 914 pg/mL  Folate     Status: None   Collection Time: 04/19/23  5:14 PM  Result Value Ref Range   Folate 14.6 >5.9 ng/mL  Iron and TIBC     Status: Abnormal   Collection Time: 04/19/23  5:14 PM  Result Value Ref Range   Iron 123 28 - 170 ug/dL   TIBC 478 (L) 295 - 621 ug/dL   Saturation Ratios 68 (H) 10.4 - 31.8 %   UIBC 59 ug/dL  Ferritin     Status: Abnormal   Collection Time: 04/19/23  5:14 PM  Result Value Ref Range   Ferritin 1,108 (H) 11 - 307 ng/mL  CBC     Status: Abnormal   Collection Time: 04/20/23  4:18 AM  Result Value Ref Range   WBC 7.5 4.0 - 10.5 K/uL   RBC 2.44 (L) 3.87 - 5.11 MIL/uL   Hemoglobin 7.7 (L) 12.0 - 15.0 g/dL   HCT 30.8 (L) 65.7 - 84.6 %   MCV 94.7 80.0 - 100.0 fL   MCH 31.6 26.0 - 34.0 pg   MCHC 33.3 30.0 - 36.0 g/dL   RDW 96.2 (H) 95.2 - 84.1 %   Platelets  247 150 - 400 K/uL   nRBC 0.0 0.0 - 0.2 %  Basic metabolic panel     Status: Abnormal   Collection Time: 04/20/23  4:18 AM  Result Value Ref Range   Sodium 137 135 - 145 mmol/L  Potassium 4.0 3.5 - 5.1 mmol/L   Chloride 107 98 - 111 mmol/L   CO2 16 (L) 22 - 32 mmol/L   Glucose, Bld 78 70 - 99 mg/dL   BUN 72 (H) 8 - 23 mg/dL   Creatinine, Ser 1.61 (H) 0.44 - 1.00 mg/dL   Calcium 8.3 (L) 8.9 - 10.3 mg/dL   GFR, Estimated 7 (L) >60 mL/min   Anion gap 14 5 - 15  Magnesium     Status: Abnormal   Collection Time: 04/20/23  4:18 AM  Result Value Ref Range   Magnesium 1.4 (L) 1.7 - 2.4 mg/dL  Surgical pcr screen     Status: None   Collection Time: 04/20/23  6:17 AM   Specimen: Nasal Mucosa; Nasal Swab  Result Value Ref Range   MRSA, PCR NEGATIVE NEGATIVE   Staphylococcus aureus NEGATIVE NEGATIVE  Type and screen [facility]     Status: None (Preliminary result)   Collection Time: 04/20/23  8:46 AM  Result Value Ref Range   ABO/RH(D) PENDING    Antibody Screen PENDING    Sample Expiration      04/23/2023,2359 Performed at [facility] [place] [facility], [address] [place], [place] [address]      Imaging or Labs ordered: None  Medical history and chart was reviewed and case discussed with medical provider.  Assessment/Plan: 89 year old female with end-stage renal disease on hospice with mild dementia with right proximal femur fracture.  I discussed the patient's predicament with both her as well as her son over the phone.  The patient is in severe pain and cannot mobilize or move without severe pain in her leg.  Without surgical fixation I feel that she will not be able to mobilize and will spend the rest of her life in bed.  The patient is aware of what is going on for the most part and she does not wish to be bedbound for the rest of her life.  The son does not wish this either.  They were concerned about her elevated creatinine but her potassium is within normal limits.   Her hemoglobin has come up to 7.6 this morning.  I discussed other risks of the procedure with the son and this included bleeding, infection, malunion, nonunion, hardware failure, hardware irritation, nerve or blood vessel injury, even the possible anesthetic complications.  He understands and wishes to proceed.  Will plan to try to proceed later today.  Roby Lofts, MD [facility] [phone] (office) [email or web address]

## 2023-04-21 ENCOUNTER — Inpatient Hospital Stay (HOSPITAL_COMMUNITY): Payer: Medicare Other

## 2023-04-21 DIAGNOSIS — S7291XA Unspecified fracture of right femur, initial encounter for closed fracture: Secondary | ICD-10-CM | POA: Diagnosis not present

## 2023-04-21 DIAGNOSIS — Z789 Other specified health status: Secondary | ICD-10-CM

## 2023-04-21 DIAGNOSIS — N179 Acute kidney failure, unspecified: Secondary | ICD-10-CM | POA: Diagnosis not present

## 2023-04-21 DIAGNOSIS — S72351A Displaced comminuted fracture of shaft of right femur, initial encounter for closed fracture: Secondary | ICD-10-CM | POA: Diagnosis not present

## 2023-04-21 DIAGNOSIS — R52 Pain, unspecified: Secondary | ICD-10-CM

## 2023-04-21 DIAGNOSIS — D649 Anemia, unspecified: Secondary | ICD-10-CM | POA: Diagnosis not present

## 2023-04-21 LAB — CBC
HCT: 24.8 % — ABNORMAL LOW (ref 36.0–46.0)
Hemoglobin: 8.1 g/dL — ABNORMAL LOW (ref 12.0–15.0)
MCH: 30.6 pg (ref 26.0–34.0)
MCHC: 32.7 g/dL (ref 30.0–36.0)
MCV: 93.6 fL (ref 80.0–100.0)
Platelets: 245 10*3/uL (ref 150–400)
RBC: 2.65 MIL/uL — ABNORMAL LOW (ref 3.87–5.11)
RDW: 19.7 % — ABNORMAL HIGH (ref 11.5–15.5)
WBC: 10.6 10*3/uL — ABNORMAL HIGH (ref 4.0–10.5)
nRBC: 0 % (ref 0.0–0.2)

## 2023-04-21 LAB — BPAM RBC
Blood Product Expiration Date: 202502212359
ISSUE DATE / TIME: 202502161351
Unit Type and Rh: 600

## 2023-04-21 LAB — TYPE AND SCREEN
ABO/RH(D): A NEG
Antibody Screen: NEGATIVE
Unit division: 0

## 2023-04-21 LAB — BASIC METABOLIC PANEL
Anion gap: 14 (ref 5–15)
BUN: 76 mg/dL — ABNORMAL HIGH (ref 8–23)
CO2: 13 mmol/L — ABNORMAL LOW (ref 22–32)
Calcium: 8 mg/dL — ABNORMAL LOW (ref 8.9–10.3)
Chloride: 109 mmol/L (ref 98–111)
Creatinine, Ser: 5.55 mg/dL — ABNORMAL HIGH (ref 0.44–1.00)
GFR, Estimated: 7 mL/min — ABNORMAL LOW (ref >60)
Glucose, Bld: 98 mg/dL (ref 70–99)
Potassium: 4.6 mmol/L (ref 3.5–5.1)
Sodium: 136 mmol/L (ref 135–145)

## 2023-04-21 MED ORDER — HYDROMORPHONE HCL 1 MG/ML IJ SOLN
0.5000 mg | INTRAMUSCULAR | Status: DC | PRN
  Administered 2023-04-21 – 2023-04-22 (×2): 0.5 mg via INTRAVENOUS
  Filled 2023-04-21 (×2): qty 0.5

## 2023-04-21 MED ORDER — SODIUM BICARBONATE 650 MG PO TABS
650.0000 mg | ORAL_TABLET | Freq: Three times a day (TID) | ORAL | Status: DC
  Administered 2023-04-21 – 2023-04-22 (×3): 650 mg via ORAL
  Filled 2023-04-21 (×4): qty 1

## 2023-04-21 MED ORDER — VITAMIN D (ERGOCALCIFEROL) 1.25 MG (50000 UNIT) PO CAPS: 50000.0000 [IU] | ORAL_CAPSULE | ORAL | Status: DC

## 2023-04-21 NOTE — TOC Initial Note (Addendum)
 Transition of Care [facility]) - Initial/Assessment Note    Patient Details  Name: Joanne Shaw MRN: [id number] Date of Birth: [birth year]  Transition [facility]) CM/SW Contact:    Lorri Frederick, LCSW Phone Number: 04/21/2023, 3:15 PM  Clinical Narrative:     Pt oriented x1, unable to provide information.  CSW unable to reach pt son, [name], message left.  CSW spoke with [facility] memory care.  She spoke with pt son this AM and he is planning on pt to return with hospice services.  [facility] can receive pt back as long as [facility] in place.  Team updated,   Nicole/[facility] aware.           Son [name] called back.  Working on [employer] with elderlaw firm but does want pt to return to [facility] rather than another SNF, trying to minimize moves.    Expected Discharge Plan: Memory Care [facility] memory care) Barriers to Discharge: No Barriers Identified   Patient Goals and CMS Choice            Expected Discharge Plan and Services In-house Referral: Clinical Social Work     Living arrangements for the past 2 months: Assisted Living Facility [facility] memory care)                                      Prior Living Arrangements/Services Living arrangements for the past 2 months: Assisted Living Facility [facility] memory care) Lives with:: Facility Resident Patient language and need for interpreter reviewed:: Yes        Need for Family Participation in Patient Care: Yes (Comment) Care giver support system in place?: Yes (comment) Current home services: Hospice ([facility]) Criminal Activity/Legal Involvement Pertinent to Current Situation/Hospitalization: No - Comment as needed  Activities of Daily Living      Permission Sought/Granted                  Emotional Assessment Appearance:: Appears stated age Attitude/Demeanor/Rapport: Unable to Assess Affect (typically observed): Unable to Assess Orientation:  : Oriented to Self      Admission diagnosis:  AKI (acute kidney injury) (HCC) [N17.9] Right femoral fracture (HCC) [S72.91XA] Anemia, unspecified type [D64.9] Closed fracture of right femur, unspecified fracture morphology, unspecified portion of femur, initial encounter (HCC) [S72.91XA] Patient Active Problem List   Diagnosis Date Noted   Palliative care encounter 04/19/2023   Counseling and coordination of care 04/19/2023   Right femoral fracture (HCC) 04/18/2023   Periorbital contusion of left eye 12/10/2022   AKI (acute kidney injury) (HCC) 12/09/2022   Anemia due to blood loss 06/30/2022   Severe protein-calorie malnutrition (HCC) 06/30/2022   Stage 3b chronic kidney disease (HCC) 06/30/2022   Hypokalemia 06/30/2022   Hypomagnesemia 06/30/2022   Frequent falls 05/26/2022   Alzheimer's dementia with anxiety (HCC) 05/26/2022   RSV infection 05/26/2022   Asymptomatic postmenopausal status 03/06/2010   Malignant neoplasm of female breast (HCC) 01/16/2010   Abnormal mammogram 11/20/2009   OSTEOARTHRITIS, KNEE, RIGHT 07/13/2009   Hypothyroidism 05/30/2009   HYPERLIPIDEMIA 05/30/2009   Essential hypertension 05/30/2009   PCP:  Christene Lye, FNP Pharmacy:   [id number] - [place], [place] - [address] AT [facility] [address] [place] [address] Phone: [phone] Fax: [phone]  [facility] [address] [place] [address] Phone: [phone] Fax:  [phone]     Social Drivers of Health (SDOH) Social History: SDOH Screenings   Food Insecurity: Patient Unable To Answer (04/20/2023)  Housing: Patient Unable To Answer (04/20/2023)  Transportation Needs: Patient Unable To Answer (04/20/2023)  Utilities: Patient Unable To Answer (04/20/2023)  Financial Resource Strain: Low Risk  (12/23/2021)   Received from [facility]  Physical Activity: Inactive (12/23/2021)    Received from [facility]  Social Connections: Unknown (04/20/2023)  Stress: No Stress Concern Present (12/23/2021)   [facility] from [facility]  Tobacco Use: Medium Risk (04/20/2023)   SDOH Interventions:     Readmission Risk Interventions    12/11/2022    1:50 PM  Readmission Risk Prevention Plan  Transportation Screening Complete  PCP or Specialist Appt within 5-7 Days Complete  Home Care Screening Complete  Medication Review (RN CM) Complete

## 2023-04-21 NOTE — Progress Notes (Signed)
 Joanne Shaw 6V78 [facility] Patient Note    Joanne Shaw is a current hospice patient with a terminal diagnosis of End Stage Renal Disease. Facility notified [facility] that patient had been screaming and in pain whrn right leg touched. States that patient sustained a fall prior. Nurse came and evaluated patient and she appeared to be in severe pain. Leg looked deformed. Attempted to have mobile x-ray, however patient unable to hold still to have this completed. Staff notified family and it was decided to activate EMS and have patient evaluated at hospital. Patient was admitted to [facility] on 2.14. 25 with a diagnosis of right femoral shaft fracture.  Per Dr. Barbee Shaw with [facility], this is a related hospital admission.   Visited patient at bedside this morning. She appeared to be in quite a bit of pain and required several doses of IV pain medication. Floor Nurse notified that patient verbalized pain and needed medication. She remains inpatient appropriate due to the need for pain management following femur repair surgery.    Vital Signs: 97.5/62/18    138/66    95% on ra   I&O 1869.5/650   Abnormal labs:  04/21/23 05:37 BASIC METABOLIC PANEL: Rpt ! CO2: 13 (L) BUN: 76 (H) Creatinine: 5.55 (H) Calcium: 8.0 (L) GFR, Estimated: 7 (L) WBC: 10.6 (H) RBC: 2.65 (L) Hemoglobin: 8.1 (L) HCT: 24.8 (L) RDW: 19.7 (H)     Diagnostics: 2.15.25 DG FEMUR, MIN 2 VIEWS RIGHT   Narrative CLINICAL DATA:  Elective surgery.  EXAM: RIGHT FEMUR 2 VIEWS  COMPARISON:  Preoperative imaging  FINDINGS: Eight fluoroscopic spot views of the right femur from the operating room. Lateral plate and screw fixation of femoral shaft fracture.      IV/PRN Meds: Ancef 1g IV x1, Fentanyl IV x 3, Dilaudid 0.5mg  x1, Ultram 50mg  PO x2  Problem list as per Joanne Hawking, MD 2.17.2025  Assessment and Plan:   Displaced right femoral shaft  fracture Secondary to fall. Orthopedic surgery consulted with plan for surgical repair.   Acute blood loss anemia Chronic anemia Acute blood loss related to femur fracture. Chronic anemia likely related to chronic illness. Patient's baseline hemoglobin is about 7-8 g/dL. Hemoglobin of 7.6 g/dL on admission with low of 6.8 g/dL requiring a transfusion of 1 unit of PRBC. Post-transfusion hemoglobin of 7.7 g/dL and hemoglobin stable at 8.1 g/dL.   AKI on CKD stage IIIb Baseline creatinine of 2.6-2.8. Creatinine up to 5.70 on admission. Associated BUN of 77. Not a candidate for hemodialysis. Hold torsemide.   Metabolic acidosis -Sodium bicarbonate   Cellulitis Diagnosed outpatient. Patient managed on Keflex BID as an outpatient.   Hypokalemia Mild-moderately low down to 2.9. Resolved with potassium supplementation   Primary hypertension -Continue hydralazine and Toprol-XL   Hyperlipidemia Noted. Patient not on lipid lowering medication. Unlikely helpful with current goals of care.   Hypothyroidism -Continue levothyroxine 100 mcg daily   GERD -Continue Protonix   Dementia -Continue delirium precautions   Severe malnutrition Noted.   Hx of right breast cancer Patient is currently enrolled in hospice care.   Discharge Planning: Discharge to SNF with hospice versus hospice facility    Family contact: Spoke with son Joanne Shaw. He states that he is working on Personal assistant with [employer]. The goal is to have pt to return to [facility] rather than another SNF to try to minimize moves.    IDT: Updated   Goals of Care:  DNR-Limited   Should patient need  ambulance transfer at discharge- please use [facility]) as they contract this service for our active hospice patients.    Joanne Shaw, BSN, RN Hospice Nurse Liaison [phone]

## 2023-04-21 NOTE — Plan of Care (Signed)
  Problem: Clinical Measurements: Goal: Ability to maintain clinical measurements within normal limits will improve Outcome: Progressing Goal: Will remain free from infection Outcome: Progressing Goal: Diagnostic test results will improve Outcome: Progressing Goal: Respiratory complications will improve Outcome: Progressing Goal: Cardiovascular complication will be avoided Outcome: Progressing   Problem: Elimination: Goal: Will not experience complications related to urinary retention Outcome: Progressing   Problem: Pain Managment: Goal: General experience of comfort will improve and/or be controlled Outcome: Progressing   Problem: Safety: Goal: Ability to remain free from injury will improve Outcome: Progressing   Problem: Skin Integrity: Goal: Risk for impaired skin integrity will decrease Outcome: Progressing   Problem: Clinical Measurements: Goal: Postoperative complications will be avoided or minimized Outcome: Progressing   Problem: Pain Management: Goal: Pain level will decrease Outcome: Progressing

## 2023-04-21 NOTE — Progress Notes (Signed)
 Occupational Therapy Evaluation Patient Details Name: Joanne Shaw MRN: [id number] DOB: 1931/02/07 Today's Date: 04/21/2023   History of Present Illness   Pt is a 89 y/o female who presents 04/18/2023 s/p fall, sustaining a R femur fracture. She is now s/p ORIF R femur on 04/20/2023 and is now NWB on the RLE. PMH signficant for R breast CA, HTN, hypothyroidism, occipital neuralgia 30 years ago, osteopenia, R knee DJD, dementia.     Clinical Impressions Pt admitted for above and limited by problem list below.  Per chart review from admission 12/2022, PTA patient was able to manage ADLs with support but was provided supervision for showers, self propelling W/C, and staff assisting with meals, meds.  Pt lives at ALF, and has support from staff. Today, patient requires total assist +2 assist with bed mobility, max to total assist +2  with ADLs, and  unable to progress past EOB today due to pain. Cognitively, patient has hx of dementia but only oriented to self today; follows 1 step commands inconsistently. Based on performance today, believe patient will best benefit from continued OT services acutely and after dc at inpatient setting with <3hrs/day to optimize independence, safety with ADLs and mobility.      If plan is discharge home, recommend the following:   Two people to help with walking and/or transfers;Two people to help with bathing/dressing/bathroom;Assistance with cooking/housework;Assistance with feeding;Direct supervision/assist for medications management;Direct supervision/assist for financial management;Assist for transportation;Help with stairs or ramp for entrance;Supervision due to cognitive status     Functional Status Assessment   Patient has had a recent decline in their functional status and demonstrates the ability to make significant improvements in function in a reasonable and predictable amount of time.     Equipment Recommendations   None recommended by OT      Recommendations for Other Services         Precautions/Restrictions   Precautions Precautions: Fall Recall of Precautions/Restrictions: Impaired Restrictions Weight Bearing Restrictions Per Provider Order: Yes RLE Weight Bearing Per Provider Order: Non weight bearing     Mobility Bed Mobility Overal bed mobility: Needs Assistance Bed Mobility: Rolling, Supine to Sit, Sit to Supine Rolling: Total assist   Supine to sit: Total assist, +2 for physical assistance, HOB elevated Sit to supine: Total assist, +2 for physical assistance   General bed mobility comments: Multimodal cues but pt with increased difficulty following commands. +2 and bed pad utilized for transition to/from EOB.    Transfers                   General transfer comment: Unable to progress to OOB transfers at this time. Will require use of a lift for OOB with nursing staff.      Balance Overall balance assessment: Needs assistance Sitting-balance support: Feet supported Sitting balance-Leahy Scale: Poor Sitting balance - Comments: relies on external support, L lateral lean Postural control: Posterior lean, Left lateral lean                                 ADL either performed or assessed with clinical judgement   ADL Overall ADL's : Needs assistance/impaired     Grooming: Maximal assistance;Bed level Grooming Details (indicate cue type and reason): pt able to wipe mouth, but otherwise would need assist for grooming         Upper Body Dressing : Maximal assistance;Sitting   Lower Body Dressing: Total assistance;Sitting/lateral leans;Bed  level;+2 for physical assistance     Toilet Transfer Details (indicate cue type and reason): deferred         Functional mobility during ADLs: Total assistance;+2 for physical assistance       Vision         Perception         Praxis         Pertinent Vitals/Pain Pain Assessment Pain Assessment: PAINAD Breathing:  occasional labored breathing, short period of hyperventilation Negative Vocalization: occasional moan/groan, low speech, negative/disapproving quality Facial Expression: sad, frightened, frown Body Language: tense, distressed pacing, fidgeting Consolability: distracted or reassured by voice/touch PAINAD Score: 5 Pain Intervention(s): Monitored during session, Limited activity within patient's tolerance, Premedicated before session, Repositioned     Extremity/Trunk Assessment Upper Extremity Assessment Upper Extremity Assessment: Generalized weakness;Difficult to assess due to impaired cognition (pt tends to hold UEs in flexion and resists movement at times)   Lower Extremity Assessment Lower Extremity Assessment: Defer to PT evaluation RLE Deficits / Details: Bilaterally weak with acute pain, decreased strength and AROM in RLE due to trauma and subsequent surgery.   Cervical / Trunk Assessment Cervical / Trunk Assessment: Kyphotic   Communication Communication Communication: Impaired   Cognition Arousal: Alert Behavior During Therapy: Anxious Cognition: History of cognitive impairments             OT - Cognition Comments: hx of dementia. oriented to self only                 Following commands: Impaired Following commands impaired: Follows one step commands inconsistently     Cueing  General Comments   Cueing Techniques: Verbal cues;Gestural cues;Tactile cues;Visual cues      Exercises     Shoulder Instructions      Home Living Family/patient expects to be discharged to:: Assisted living                             Home Equipment: Wheelchair - manual;Grab bars - tub/shower;Grab bars - toilet;Hand held shower head;Rolling Walker (2 wheels)          Prior Functioning/Environment Prior Level of Function : Needs assist;Patient poor historian/Family not available             Mobility Comments: per 12/2022: Has RW that she uses  occassionally. Primarily WC bound and able to self propel around without assist. Non-ambulatory. pt unable to report today ADLs Comments: per 12/2022: Supervision is provided for showers. Staff does laundry and meals for patient. Mod I for dressing, toileting, and grooming. pt unable to report today    OT Problem List: Decreased strength;Decreased range of motion;Decreased activity tolerance;Impaired balance (sitting and/or standing);Decreased coordination;Decreased cognition;Decreased safety awareness;Decreased knowledge of use of DME or AE;Decreased knowledge of precautions;Pain   OT Treatment/Interventions: Self-care/ADL training;Therapeutic exercise;DME and/or AE instruction;Therapeutic activities;Cognitive remediation/compensation;Balance training;Patient/family education      OT Goals(Current goals can be found in the care plan section)   Acute Rehab OT Goals Patient Stated Goal: none stated OT Goal Formulation: Patient unable to participate in goal setting Time For Goal Achievement: 05/05/23 Potential to Achieve Goals: Fair   OT Frequency:  Min 1X/week    Co-evaluation PT/OT/SLP Co-Evaluation/Treatment: Yes Reason for Co-Treatment: Complexity of the patient's impairments (multi-system involvement);Necessary to address cognition/behavior during functional activity;For patient/therapist safety;To address functional/ADL transfers PT goals addressed during session: Mobility/safety with mobility OT goals addressed during session: ADL's and self-care      AM-PAC OT "  6 Clicks" Daily Activity     Outcome Measure Help from another person eating meals?: A Lot Help from another person taking care of personal grooming?: A Lot Help from another person toileting, which includes using toliet, bedpan, or urinal?: Total Help from another person bathing (including washing, rinsing, drying)?: A Lot Help from another person to put on and taking off regular upper body clothing?: A Lot Help from  another person to put on and taking off regular lower body clothing?: Total 6 Click Score: 10   End of Session Nurse Communication: Mobility status  Activity Tolerance: Patient limited by pain Patient left: in bed;with call bell/phone within reach;with bed alarm set  OT Visit Diagnosis: Other abnormalities of gait and mobility (R26.89);Muscle weakness (generalized) (M62.81);Pain;Other symptoms and signs involving cognitive function Pain - Right/Left: Right Pain - part of body: Leg                Time: 2440-1027 OT Time Calculation (min): 23 min Charges:  OT General Charges $OT Visit: 1 Visit OT Evaluation $OT Eval Moderate Complexity: 1 Mod  Barry Brunner, OT Acute Rehabilitation Services Office [phone]   Chancy Milroy 04/21/2023, 1:50 PM

## 2023-04-21 NOTE — Progress Notes (Signed)
 Orthopaedic Trauma Progress Note  SUBJECTIVE: Doing okay this morning.  Continues to have pain in the right side.  Did not remember that she had surgery yesterday.  Dementia at baseline.  No chest pain. No SOB. No nausea/vomiting. No other complaints.  No family at bedside currently  OBJECTIVE:  Vitals:   04/21/23 0400 04/21/23 1020  BP: (!) 168/66 138/66  Pulse: 61 62  Resp: 16 18  Temp: 97.6 F (36.4 C) (!) 97.5 F (36.4 C)  SpO2: 95% 95%    General: Resting in bed comfortably, no acute distress. Respiratory: No increased work of breathing.  Right lower extremity: Bruising and swelling noted throughout the thigh.  Dressings are clean, dry, intact.  Tenderness with palpation throughout the thigh as expected.  No calf tenderness.  Patient able to wiggle her toes.  Toes warm and well-perfused.  2+ DP pulse  IMAGING: Stable post op imaging.   LABS:  Results for orders placed or performed during the hospital encounter of 04/18/23 (from the past 24 hours)  CBC     Status: Abnormal   Collection Time: 04/21/23  5:37 AM  Result Value Ref Range   WBC 10.6 (H) 4.0 - 10.5 K/uL   RBC 2.65 (L) 3.87 - 5.11 MIL/uL   Hemoglobin 8.1 (L) 12.0 - 15.0 g/dL   HCT 16.1 (L) 09.6 - 04.5 %   MCV 93.6 80.0 - 100.0 fL   MCH 30.6 26.0 - 34.0 pg   MCHC 32.7 30.0 - 36.0 g/dL   RDW 40.9 (H) 81.1 - 91.4 %   Platelets 245 150 - 400 K/uL   nRBC 0.0 0.0 - 0.2 %  Basic metabolic panel     Status: Abnormal   Collection Time: 04/21/23  5:37 AM  Result Value Ref Range   Sodium 136 135 - 145 mmol/L   Potassium 4.6 3.5 - 5.1 mmol/L   Chloride 109 98 - 111 mmol/L   CO2 13 (L) 22 - 32 mmol/L   Glucose, Bld 98 70 - 99 mg/dL   BUN 76 (H) 8 - 23 mg/dL   Creatinine, Ser 7.82 (H) 0.44 - 1.00 mg/dL   Calcium 8.0 (L) 8.9 - 10.3 mg/dL   GFR, Estimated 7 (L) >60 mL/min   Anion gap 14 5 - 15  Blood transfusion report - scanned     Status: None ()   Collection Time: 04/21/23  9:41 AM   Narrative   Ordered by an  unspecified provider.  Blood transfusion report - scanned     Status: None   Collection Time: 04/21/23  9:41 AM   Narrative   Ordered by an unspecified provider.    ASSESSMENT: Joanne Shaw is a 89 y.o. female, 1 Day Post-Op s/p ORIF RIGHT PERIPROSTHETIC FEMUR FRACTURE  CV/Blood loss: Acute blood loss anemia, Hgb 8.1 this morning.  Received 1 unit PRBCs on 04/19/2023 and another 1 unit PRBCs on 04/20/2023. Hemodynamically stable  PLAN: Weightbearing: NWB RLE ROM: Okay for hip and knee motion Incisional and dressing care: Reinforce dressings as needed  Showering: Okay to begin getting incisions wet 04/23/2023 Orthopedic device(s): None  Pain management:  1. Tylenol 650 mg q 4 hours PRN 2. Robaxin 500 mg q 6 hours PRN 3.  Tramadol 50 mg q 12 hours PRN 4.  Fentanyl 25 mcg q 2 hours PRN VTE prophylaxis: Aspirin 81 mg, SCDs ID: Perioperative ABX completed Foley/Lines:  No foley, KVO IVFs Impediments to Fracture Healing: Vitamin D level 18, started on supplementation Dispo: PT/OT  evaluation today.  Continue to monitor CBC.  Okay for discharge from ortho standpoint once cleared by medicine team and therapies  D/C recommendations: -Tramadol and Tylenol as needed for pain control -Aspirin 81 mg for DVT prophylaxis -Continue 50,000 units Vit D supplementation  Follow - up plan: 2 weeks after discharge for wound check and repeat x-rays   Contact information:  Truitt Merle MD, Thyra Breed PA-C. After hours and holidays please check [email or web address] for group call information for [facility]   Thompson Caul, PA-C [phone] (office) [email or web address]

## 2023-04-21 NOTE — Evaluation (Signed)
 Physical Therapy Evaluation  Patient Details Name: Joanne Shaw MRN: [id number] DOB: 30-Dec-1930 Today's Date: 04/21/2023  History of Present Illness  Pt is a 89 y/o female who presents 04/18/2023 s/p fall, sustaining a R femur fracture. She is now s/p ORIF R femur on 04/20/2023 and is now NWB on the RLE. PMH signficant for R breast CA, HTN, hypothyroidism, occipital neuralgia 30 years ago, osteopenia, R knee DJD, dementia.   Clinical Impression  Pt admitted with above diagnosis. Pt currently with functional limitations due to the deficits listed below (see PT Problem List). At the time of PT eval pt was able to perform transfers to/from EOB with +2 total assist. Pt significantly limited by pain. Will require the [facility] lift for any OOB attempts at this time with nursing staff.  Pt will benefit from acute skilled PT to increase their independence and safety with mobility to allow discharge.           If plan is discharge home, recommend the following: Two people to help with walking and/or transfers;Two people to help with bathing/dressing/bathroom;Assistance with cooking/housework;Direct supervision/assist for medications management;Direct supervision/assist for financial management;Assist for transportation;Supervision due to cognitive status   Can travel by private vehicle   No    Equipment Recommendations None recommended by PT (TBD by next venue of care)  Recommendations for Other Services       Functional Status Assessment Patient has had a recent decline in their functional status and demonstrates the ability to make significant improvements in function in a reasonable and predictable amount of time.     Precautions / Restrictions Precautions Precautions: Fall Recall of Precautions/Restrictions: Impaired Restrictions Weight Bearing Restrictions Per Provider Order: Yes RLE Weight Bearing Per Provider Order: Non weight bearing      Mobility  Bed Mobility Overal bed  mobility: Needs Assistance Bed Mobility: Rolling, Supine to Sit, Sit to Supine Rolling: Total assist   Supine to sit: Total assist, +2 for physical assistance, HOB elevated Sit to supine: Total assist, +2 for physical assistance   General bed mobility comments: Multimodal cues but pt with increased difficulty following commands. +2 and bed pad utilized for transition to/from EOB.    Transfers                   General transfer comment: Unable to progress to OOB transfers at this time. Will require use of a lift for OOB with nursing staff.    Ambulation/Gait                  Stairs            Wheelchair Mobility     Tilt Bed    Modified Rankin (Stroke Patients Only)       Balance Overall balance assessment: Needs assistance Sitting-balance support: Feet supported, Bilateral upper extremity supported Sitting balance-Leahy Scale: Poor   Postural control: Left lateral lean                                   Pertinent Vitals/Pain Pain Assessment Pain Assessment: PAINAD Breathing: occasional labored breathing, short period of hyperventilation Negative Vocalization: occasional moan/groan, low speech, negative/disapproving quality Facial Expression: sad, frightened, frown Body Language: tense, distressed pacing, fidgeting Consolability: distracted or reassured by voice/touch PAINAD Score: 5 Pain Intervention(s): Monitored during session, Limited activity within patient's tolerance, Repositioned, Premedicated before session    Home Living Family/patient expects to be discharged to:: Assisted  living                 Home Equipment: Wheelchair - manual;Grab bars - tub/shower;Grab bars - toilet;Hand held Programmer, systems (2 wheels)      Prior Function Prior Level of Function : Needs assist             Mobility Comments: per 12/2022: Has RW that she uses occassionally. Primarily WC bound and able to self propel around  without assist. Non-ambulatory. ADLs Comments: per 12/2022: Supervision is provided for showers. Staff does laundry and meals for patient. Mod I for dressing, toileting, and grooming.     Extremity/Trunk Assessment   Upper Extremity Assessment Upper Extremity Assessment: Defer to OT evaluation    Lower Extremity Assessment Lower Extremity Assessment: RLE deficits/detail;LLE deficits/detail RLE Deficits / Details: Bilaterally weak with acute pain, decreased strength and AROM in RLE due to trauma and subsequent surgery.    Cervical / Trunk Assessment Cervical / Trunk Assessment: Kyphotic  Communication   Communication Communication: Impaired    Cognition Arousal: Alert Behavior During Therapy: Anxious   PT - Cognitive impairments: History of cognitive impairments                         Following commands: Impaired Following commands impaired: Follows one step commands inconsistently     Cueing Cueing Techniques: Verbal cues, Gestural cues, Tactile cues, Visual cues     General Comments      Exercises     Assessment/Plan    PT Assessment Patient needs continued PT services  PT Problem List Decreased strength;Decreased range of motion;Decreased activity tolerance;Decreased balance;Decreased mobility;Decreased knowledge of use of DME;Decreased safety awareness;Decreased knowledge of precautions;Pain       PT Treatment Interventions DME instruction;Therapeutic activities;Therapeutic exercise;Balance training;Patient/family education;Wheelchair mobility training;Functional mobility training    PT Goals (Current goals can be found in the Care Plan section)  Acute Rehab PT Goals Patient Stated Goal: None stated PT Goal Formulation: Patient unable to participate in goal setting Potential to Achieve Goals: Fair    Frequency Min 1X/week     Co-evaluation PT/OT/SLP Co-Evaluation/Treatment: Yes Reason for Co-Treatment: Complexity of the patient's impairments  (multi-system involvement);Necessary to address cognition/behavior during functional activity;For patient/therapist safety;To address functional/ADL transfers PT goals addressed during session: Mobility/safety with mobility;Balance;Strengthening/ROM         AM-PAC PT "6 Clicks" Mobility  Outcome Measure Help needed turning from your back to your side while in a flat bed without using bedrails?: Total Help needed moving from lying on your back to sitting on the side of a flat bed without using bedrails?: Total Help needed moving to and from a bed to a chair (including a wheelchair)?: Total Help needed standing up from a chair using your arms (e.g., wheelchair or bedside chair)?: Total Help needed to walk in hospital room?: Total Help needed climbing 3-5 steps with a railing? : Total 6 Click Score: 6    End of Session   Activity Tolerance: Patient limited by pain Patient left: in bed;with call bell/phone within reach;with bed alarm set Nurse Communication: Mobility status PT Visit Diagnosis: Other abnormalities of gait and mobility (R26.89);Muscle weakness (generalized) (M62.81);Pain Pain - Right/Left: Right Pain - part of body: Hip    Time: 1157-1220 PT Time Calculation (min) (ACUTE ONLY): 23 min   Charges:   PT Evaluation $PT Eval High Complexity: 1 High   PT General Charges $$ ACUTE PT VISIT: 1 Visit  Conni Slipper, PT, DPT Acute Rehabilitation Services Secure Chat Preferred Office: [phone]   Marylynn Pearson 04/21/2023, 1:46 PM

## 2023-04-21 NOTE — Care Management Important Message (Signed)
 Important Message  Patient Details  Name: Joanne Shaw MRN: [id number] Date of Birth: [birth year]   Important Message Given:  Yes - Medicare IM     Sherilyn Banker 04/21/2023, 3:18 PM

## 2023-04-21 NOTE — Progress Notes (Addendum)
 PROGRESS NOTE    Joanne Shaw  JYN:[id number] DOB: 31-Mar-1930 DOA: 04/18/2023 PCP: Christene Lye, FNP   Brief Narrative: Jalee Saine is a 89 y.o. female with a history of hypertension, hyperlipidemia, hypothyroidism.  Patient presented secondary to right leg pain after a fall and found to have a displaced right femoral shaft fracture. Orthopedic surgery consulted with plan for surgical repair.   Assessment and Plan:  Displaced right femoral shaft fracture Secondary to fall. Orthopedic surgery consulted with performed an ORIF on 2/16. Patient is nonweight bearing to RLE; okay for hip and knee motion.  Acute blood loss anemia Chronic anemia Acute blood loss related to femur fracture. Chronic anemia likely related to chronic illness. Patient's baseline hemoglobin is about 7-8 g/dL. Hemoglobin of 7.6 g/dL on admission with low of 6.8 g/dL requiring a transfusion of 1 unit of PRBC. Post-transfusion hemoglobin of 7.7 g/dL and hemoglobin stable at 8.1 g/dL.  AKI on CKD stage IIIb Baseline creatinine of 2.6-2.8. Creatinine up to 5.70 on admission. Associated BUN of 77. Not a candidate for hemodialysis. Hold torsemide.  Metabolic acidosis -Sodium bicarbonate  Cellulitis Diagnosed outpatient. Patient managed on Keflex BID as an outpatient.  Hypokalemia Mild-moderately low down to 2.9. Resolved with potassium supplementation  Primary hypertension -Continue hydralazine and Toprol-XL  Hyperlipidemia Noted. Patient not on lipid lowering medication. Unlikely helpful with current goals of care.  Hypothyroidism -Continue levothyroxine 100 mcg daily  GERD -Continue Protonix  Dementia -Continue delirium precautions  Severe malnutrition Noted.  Hx of right breast cancer Patient is currently enrolled in hospice care.     DVT prophylaxis: SCDs Code Status:   Code Status: Limited: Do not attempt resuscitation (DNR) -DNR-LIMITED -Do Not Intubate/DNI  Family Communication: None  at bedside. Called son but straight to voicemail. Disposition Plan: Discharge to SNF with hospice versus hospice facility   Consultants:  Orthopedic surgery Palliative care medicine  Procedures:  None  Antimicrobials: Keflex    Subjective: Some leg pain. No other concerns.  Objective: BP 138/66 (BP Location: Right Arm)   Pulse 62   Temp (!) 97.5 F (36.4 C) (Oral)   Resp 18   Ht 5\' 3"  (1.6 m)   Wt 54.8 kg   SpO2 95%   BMI 21.40 kg/m   Examination:  General exam: Appears calm and comfortable Respiratory system: Clear to auscultation. Respiratory effort normal. Cardiovascular system: S1 & S2 heard, RRR. Gastrointestinal system: Abdomen is nondistended, soft and nontender. Normal bowel sounds heard. Central nervous system: Alert.  Data Reviewed: I have personally reviewed following labs and imaging studies  CBC Lab Results  Component Value Date   WBC 10.6 (H) 04/21/2023   RBC 2.65 (L) 04/21/2023   HGB 8.1 (L) 04/21/2023   HCT 24.8 (L) 04/21/2023   MCV 93.6 04/21/2023   MCH 30.6 04/21/2023   PLT 245 04/21/2023   MCHC 32.7 04/21/2023   RDW 19.7 (H) 04/21/2023   LYMPHSABS 1.7 04/18/2023   MONOABS 0.5 04/18/2023   EOSABS 0.0 04/18/2023   BASOSABS 0.0 04/18/2023     Last metabolic panel Lab Results  Component Value Date   NA 136 04/21/2023   K 4.6 04/21/2023   CL 109 04/21/2023   CO2 13 (L) 04/21/2023   BUN 76 (H) 04/21/2023   CREATININE 5.55 (H) 04/21/2023   GLUCOSE 98 04/21/2023   GFRNONAA 7 (L) 04/21/2023   CALCIUM 8.0 (L) 04/21/2023   PHOS 3.6 12/10/2022   PROT 5.8 (L) 04/19/2023   ALBUMIN 2.4 (L) 04/19/2023  BILITOT 0.6 04/19/2023   ALKPHOS 87 04/19/2023   AST 96 (H) 04/19/2023   ALT 70 (H) 04/19/2023   ANIONGAP 14 04/21/2023    GFR: Estimated Creatinine Clearance: 5.6 mL/min (A) (by C-G formula based on SCr of 5.55 mg/dL (H)).  Recent Results (from the past 240 hours)  Surgical pcr screen     Status: None   Collection Time: 04/20/23   6:17 AM   Specimen: Nasal Mucosa; Nasal Swab  Result Value Ref Range Status   MRSA, PCR NEGATIVE NEGATIVE Final   Staphylococcus aureus NEGATIVE NEGATIVE Final    Comment: (NOTE) The Xpert SA Assay (FDA approved for NASAL specimens in patients 45 years of age and older), is one component of a comprehensive surveillance program. It is not intended to diagnose infection nor to guide or monitor treatment. Performed at [facility], [address] [place], [place] [address]       Radiology Studies: DG FEMUR PORT, MIN 2 VIEWS RIGHT Result Date: 04/20/2023 CLINICAL DATA:  Fracture, postop. EXAM: RIGHT FEMUR PORTABLE 2 VIEW COMPARISON:  Radiograph 04/18/2023 FINDINGS: Lateral plate and screw fixation of comminuted femoral shaft fracture. There is mild residual displacement, although overall improved alignment. Previous right hip pinning. Recent postsurgical change includes air and edema in the soft tissues. IMPRESSION: ORIF femoral shaft fracture. Electronically Signed   By: Narda Rutherford M.D.   On: 04/20/2023 15:17   DG FEMUR, MIN 2 VIEWS RIGHT Result Date: 04/20/2023 CLINICAL DATA:  Elective surgery. EXAM: RIGHT FEMUR 2 VIEWS COMPARISON:  Preoperative imaging FINDINGS: Eight fluoroscopic spot views of the right femur from the operating room. Lateral plate and screw fixation of femoral shaft fracture. Previous right proximal femur pinning. Fluoroscopy time 75.2 seconds. Dose 4.68 mGy. IMPRESSION: Procedural fluoroscopy during femur fracture ORIF. Electronically Signed   By: Narda Rutherford M.D.   On: 04/20/2023 14:41   DG C-Arm 1-60 Min-No Report Result Date: 04/20/2023 Fluoroscopy was utilized by the requesting physician.  No radiographic interpretation.   DG C-Arm 1-60 Min-No Report Result Date: 04/20/2023 Fluoroscopy was utilized by the requesting physician.  No radiographic interpretation.      LOS: 3 days    Jacquelin Hawking, MD [facility] 04/21/2023, 11:40  AM   If 7PM-7AM, please contact night-coverage [email or web address]

## 2023-04-21 NOTE — Progress Notes (Signed)
 Daily Progress Note   Patient Name: Joanne Shaw       Date: 04/21/2023 DOB: 1930-11-05  Age: 89 y.o. MRN#: [id number] Attending Physician: Narda Bonds, MD Primary Care Physician: Christene Lye, FNP Admit Date: 04/18/2023  Reason for Consultation/Follow-up: Establishing goals of care  Subjective: I have reviewed medical records including EPIC notes, MAR, any available advanced directives as necessary, and labs. ORIF completed yesterday. Per ortho, patient ready for discharge from their standpoint. Received report from primary RN - acute concern of patient's severe pain.  Per RN patient has been crying for a while due to severe pain and is not yet time for PRN fentanyl dose.  Went to visit patient at bedside - no family/visitors present.  Patient was lying in bed awake, alert, disoriented, and able to participate in simple conversation.  She is crying and tells me that she is having a lot of pain in her legs and back. No respiratory distress, increased work of breathing, or secretions noted.   Discontinued fentanyl.  Added Dilaudid IV as needed for severe pain.  1:38 PM Attempted to call son/John to discuss possible transition to full comfort in house now that surgical intervention has been completed - no answer -unable to leave confidential voicemail.   Patient/family have a well-established relationship with [facility] hospice - they will continue to follow for GOC regarding disposition.    Length of Stay: 3  Current Medications: Scheduled Meds:   sodium chloride   Intravenous Once   sodium chloride   Intravenous Once   aspirin EC  81 mg Oral Daily   cephALEXin  500 mg Oral Daily   docusate sodium  100 mg Oral BID   ferrous sulfate  325 mg Oral QODAY   hydrALAZINE  25 mg Oral TID    levothyroxine  100 mcg Oral Q0600   metoprolol succinate  50 mg Oral Daily   pantoprazole  40 mg Oral QPM   polyethylene glycol  17 g Oral Daily   sertraline  50 mg Oral Daily   sodium bicarbonate  650 mg Oral TID   torsemide  10 mg Oral BID    Continuous Infusions:   PRN Meds: acetaminophen, fentaNYL (SUBLIMAZE) injection, metoCLOPramide **OR** metoCLOPramide (REGLAN) injection, ondansetron **OR** ondansetron (ZOFRAN) IV, senna, traMADol  Physical Exam Vitals and nursing note reviewed.  Constitutional:      General: She is not in acute distress. Pulmonary:     Effort: No respiratory distress.  Skin:    General: Skin is warm and dry.  Neurological:     Mental Status: She is alert. Mental status is at baseline. She is disoriented and confused.     Motor: Weakness present.  Psychiatric:        Mood and Affect: Affect is tearful.        Behavior: Behavior is cooperative.        Cognition and Memory: Cognition is impaired. Memory is impaired.             Vital Signs: BP 138/66 (BP Location: Right Arm)   Pulse 62   Temp (!) 97.5 F (36.4 C) (Oral)   Resp 18   Ht 5\' 3"  (1.6 m)   Wt 54.8 kg   SpO2 95%   BMI 21.40 kg/m  SpO2: SpO2: 95 % O2 Device: O2 Device: Room Air O2 Flow Rate:    Intake/output summary:  Intake/Output Summary (Last 24 hours) at 04/21/2023 1322 Last data filed at 04/21/2023 1154 Gross per 24 hour  Intake 1553.5 ml  Output 350 ml  Net 1203.5 ml   LBM: Last BM Date : 04/19/23 Baseline Weight: Weight: 54.8 kg Most recent weight: Weight: 54.8 kg       Palliative Assessment/Data: PPS 30%      Patient Active Problem List   Diagnosis Date Noted   Palliative care encounter 04/19/2023   Counseling and coordination of care 04/19/2023   Right femoral fracture (HCC) 04/18/2023   Periorbital contusion of left eye 12/10/2022   AKI (acute kidney injury) (HCC) 12/09/2022   Anemia due to blood loss 06/30/2022   Severe protein-calorie malnutrition  (HCC) 06/30/2022   Stage 3b chronic kidney disease (HCC) 06/30/2022   Hypokalemia 06/30/2022   Hypomagnesemia 06/30/2022   Frequent falls 05/26/2022   Alzheimer's dementia with anxiety (HCC) 05/26/2022   RSV infection 05/26/2022   Asymptomatic postmenopausal status 03/06/2010   Malignant neoplasm of female breast (HCC) 01/16/2010   Abnormal mammogram 11/20/2009   OSTEOARTHRITIS, KNEE, RIGHT 07/13/2009   Hypothyroidism 05/30/2009   HYPERLIPIDEMIA 05/30/2009   Essential hypertension 05/30/2009    Palliative Care Assessment & Plan   Patient Profile: Patient is a 89 year old female with a past medical history of right breast cancer, hyperlipidemia, hypertension, hypothyroidism, and dementia who was receiving long-term care management at a facility with hospice support was admitted on 04/18/2023 for management after a potential fall sustaining displaced right femoral shaft fracture.  Patient seen in [place] ER for management and orthopedics consulted for evaluation.  Patient also receiving medical management for acute blood loss anemia, acute kidney injury on CKD, and multiple electrolyte abnormalities.  Palliative medicine team consulted to assist with complex medical decision making.   Extensive review of EMR prior to presenting to bedside.  Also able to review patient's documentation from nursing facility at bedside including patient's DNR/DNI form.   When presenting to bedside to see patient in ER, patient laying in bed and appears uncomfortable.  No family present at bedside. Staff working to obtain further IV access.  Patient appears chronically ill and grimaces frequently secondary to pain.  Patient is awake and can interact with this provider though was overall confused.  Tried to provide emotional support for patient at bedside as she seemed distressed about current situation and overwhelmed.   Did discuss care with RN for medical updates. Then able  to discuss care with hospitalist for  medical updates.  Hospitalist and orthopedist had chance to speak with patient's son prior to this provider seeing patient.  After discussion, son has pursued patient being transferred to [facility] for orthopedic intervention.  Patient potentially going to the OR today.   Concerns noted with patient's history of underlying dementia and poor nutritional status.  Noted palliative medicine team continuing to follow upon transfer.  Assessment: Principal Problem:   Right femoral fracture (HCC) Active Problems:   Malignant neoplasm of female breast (HCC)   Hypothyroidism   Essential hypertension   Severe protein-calorie malnutrition (HCC)   Stage 3b chronic kidney disease (HCC)   Hypokalemia   Hypomagnesemia   Frequent falls   Alzheimer's dementia with anxiety [facility])   Palliative care encounter   Counseling and coordination of care   Recommendations/Plan: Continue current gentle medical treatment  Continue DNR-Limited as previously documented - durable DNR form completed and placed in shadow chart. Copy was made and will be scanned into Vynca/ACP tab Discontinued IV fentanyl. Started dilaudid IV PRN severe pain. Unable to speak with son today and unable to leave VM. Now that surgical intervention completed, recommend discussion of comfort in house while waiting for patient's discharge back with hospice (if this remains the plan) This is currently a GIP admission with [facility] hosp[facility] - they will continue to follow for disposition planning along with TOC PMT will continue to follow peripherally. If there are any imminent needs please call the service directly  Goals of Care and Additional Recommendations: Limitations on Scope of Treatment: No Tracheostomy  Code Status:    Code Status Orders  (From admission, onward)           Start     Ordered   04/19/23 0822  Do not attempt resuscitation (DNR)- Limited -Do Not Intubate (DNI)  (Code Status)  Continuous       Question Answer  Comment  If pulseless and not breathing No CPR or chest compressions.   In Pre-Arrest Conditions (Patient Is Breathing and Has A Pulse) Do not intubate. Provide all appropriate non-invasive medical interventions. Avoid ICU transfer unless indicated or required.   Consent: Discussion documented in EHR or advanced directives reviewed      04/19/23 0821           Code Status History     Date Active Date Inactive Code Status Order ID Comments User Context   04/18/2023 2213 04/19/2023 0821 Full Code [id number]  Rometta Emery, MD ED   12/10/2022 1234 12/13/2022 1908 Full Code [id number]  Bobette Mo, MD Inpatient       Prognosis:  < 6 months  Discharge Planning: To Be Determined  Care plan was discussed with primary RN, Dr. Caleb Popp, [facility], hospice liaison  Thank you for allowing the Palliative Medicine Team to assist in the care of this patient.     Haskel Khan, NP  Please contact Palliative Medicine Team phone at [phone] for questions and concerns.   *Portions of this note are a verbal dictation therefore any spelling and/or grammatical errors are due to the "Dragon Medical One" system interpretation.

## 2023-04-22 ENCOUNTER — Encounter (HOSPITAL_COMMUNITY): Payer: Self-pay | Admitting: Student

## 2023-04-22 DIAGNOSIS — F0284 Dementia in other diseases classified elsewhere, unspecified severity, with anxiety: Secondary | ICD-10-CM | POA: Diagnosis not present

## 2023-04-22 DIAGNOSIS — G301 Alzheimer's disease with late onset: Secondary | ICD-10-CM | POA: Diagnosis not present

## 2023-04-22 DIAGNOSIS — N1832 Chronic kidney disease, stage 3b: Secondary | ICD-10-CM | POA: Diagnosis not present

## 2023-04-22 DIAGNOSIS — Z515 Encounter for palliative care: Secondary | ICD-10-CM | POA: Diagnosis not present

## 2023-04-22 DIAGNOSIS — S72351A Displaced comminuted fracture of shaft of right femur, initial encounter for closed fracture: Secondary | ICD-10-CM | POA: Diagnosis not present

## 2023-04-22 LAB — CBC
HCT: 22.6 % — ABNORMAL LOW (ref 36.0–46.0)
Hemoglobin: 7.4 g/dL — ABNORMAL LOW (ref 12.0–15.0)
MCH: 31 pg (ref 26.0–34.0)
MCHC: 32.7 g/dL (ref 30.0–36.0)
MCV: 94.6 fL (ref 80.0–100.0)
Platelets: 214 10*3/uL (ref 150–400)
RBC: 2.39 MIL/uL — ABNORMAL LOW (ref 3.87–5.11)
RDW: 19.6 % — ABNORMAL HIGH (ref 11.5–15.5)
WBC: 9.7 10*3/uL (ref 4.0–10.5)
nRBC: 0 % (ref 0.0–0.2)

## 2023-04-22 LAB — VITAMIN D 25 HYDROXY (VIT D DEFICIENCY, FRACTURES): Vit D, 25-Hydroxy: 31.43 ng/mL (ref 30–100)

## 2023-04-22 MED ORDER — ACETAMINOPHEN 325 MG PO TABS: 650.0000 mg | ORAL_TABLET | ORAL | Status: DC | PRN

## 2023-04-22 MED ORDER — ASPIRIN 81 MG PO TBEC: 81.0000 mg | DELAYED_RELEASE_TABLET | Freq: Every day | ORAL | 0 refills | Status: AC

## 2023-04-22 MED ORDER — SODIUM BICARBONATE 650 MG PO TABS: 650.0000 mg | ORAL_TABLET | Freq: Every day | ORAL | Status: DC

## 2023-04-22 MED ORDER — TRAMADOL HCL 50 MG PO TABS: 50.0000 mg | ORAL_TABLET | Freq: Two times a day (BID) | ORAL | 0 refills | Status: DC | PRN

## 2023-04-22 NOTE — TOC Progression Note (Signed)
 Transition of Care [facility]) - Progression Note    Patient Details  Name: Joanne Shaw MRN: [id number] Date of Birth: Oct 12, 1930  Transition of Care [facility]) CM/SW Contact  Lorri Frederick, LCSW Phone Number: 04/22/2023, 3:48 PM  Clinical Narrative:   CSW spoke with Shemeka/[place] regarding pt return today.  Shemeka can receive pt today as long as [facility] is able to make in person contact with pt today.  CSW confirmed with Shawn/[facility] that they can do that.  FL2/DC summary emailed to [facility].    Expected Discharge Plan: Memory Care [facility] memory care) Barriers to Discharge: No Barriers Identified  Expected Discharge Plan and Services In-house Referral: Clinical Social Work     Living arrangements for the past 2 months: Assisted Living Facility [facility] memory care) Expected Discharge Date: 04/22/23                                     Social Determinants of Health (SDOH) Interventions SDOH Screenings   Food Insecurity: Patient Unable To Answer (04/20/2023)  Housing: Patient Unable To Answer (04/20/2023)  Transportation Needs: Patient Unable To Answer (04/20/2023)  Utilities: Patient Unable To Answer (04/20/2023)  Financial Resource Strain: Low Risk  (12/23/2021)   Received from [facility], [facility]  Physical Activity: Inactive (12/23/2021)   Received from [facility], [facility]  Social Connections: Unknown (04/20/2023)  Stress: No Stress Concern Present (12/23/2021)   Received from [facility], [facility]  Tobacco Use: Medium Risk (04/20/2023)    Readmission Risk Interventions    12/11/2022    1:50 PM  Readmission Risk Prevention Plan  Transportation Screening Complete  PCP or Specialist Appt within 5-7 Days Complete  Home Care Screening Complete  Medication Review (RN CM) Complete

## 2023-04-22 NOTE — Progress Notes (Signed)
 Orthopaedic Trauma Progress Note  SUBJECTIVE: Doing fair this morning.  Notes pain in her right leg.  Denies any significant numbness or tingling throughout the right lower extremity.  Unable to recall therapy session from yesterday.  No chest pain. No SOB. No nausea/vomiting. No other complaints.  No family at bedside currently  OBJECTIVE:  Vitals:   04/21/23 2051 04/22/23 0414  BP: 124/60 139/68  Pulse: 62 62  Resp: 18 18  Temp: 98.5 F (36.9 C) 97.7 F (36.5 C)  SpO2: 97% 98%    General: Resting in bed comfortably, no acute distress. Respiratory: No increased work of breathing.  Right lower extremity: Bruising and swelling noted throughout the thigh.  Dressings removed, incisions are are clean, dry, intact.  Incisions left open to air.  Tenderness with palpation throughout the thigh as expected.  No calf tenderness.  Patient able to wiggle her toes.  Toes warm and well-perfused.  2+ DP pulse  IMAGING: Stable post op imaging.   LABS:  Results for orders placed or performed during the hospital encounter of 04/18/23 (from the past 24 hours)  Blood transfusion report - scanned     Status: None ()   Collection Time: 04/21/23  9:41 AM   Narrative   Ordered by an unspecified provider.  Blood transfusion report - scanned     Status: None   Collection Time: 04/21/23  9:41 AM   Narrative   Ordered by an unspecified provider.    ASSESSMENT: Joanne Shaw is a 89 y.o. female, 2 Days Post-Op s/p ORIF RIGHT PERIPROSTHETIC FEMUR FRACTURE  CV/Blood loss: Acute blood loss anemia, Hgb 8.1 on 04/21/2023.  CBC pending this a.m.  Received 1 unit PRBCs on 04/19/2023 and another 1 unit PRBCs on 04/20/2023. Hemodynamically stable  PLAN: Weightbearing: NWB RLE ROM: Okay for hip and knee motion Incisional and dressing care: Reinforce dressings as needed  Showering: Okay to begin getting incisions wet 04/23/2023 Orthopedic device(s): None  Pain management:  1. Tylenol 650 mg q 4 hours PRN 2.  Robaxin 500 mg q 6 hours PRN 3.  Tramadol 50 mg q 12 hours PRN 4.  Fentanyl 25 mcg q 2 hours PRN VTE prophylaxis: Aspirin 81 mg, SCDs ID: Perioperative ABX completed Foley/Lines:  No foley, KVO IVFs Impediments to Fracture Healing: Vitamin D level pending, will start supplementation as indicated.   Dispo: PT/OT evaluation ongoing.  Plan to return to SNF at discharge.  Continue to monitor CBC.  Okay for discharge from ortho standpoint once cleared by medicine team and therapies.  Discharge Rx for pain medication and aspirin 81 mg open signed and placed in chart  D/C recommendations: -Tramadol and Tylenol as needed for pain control -Aspirin 81 mg for DVT prophylaxis -Possible need for Vit D supplementation  Follow - up plan: 2 weeks after discharge for wound check and repeat x-rays   Contact information:  Truitt Merle MD, Thyra Breed PA-C. After hours and holidays please check [email or web address] for group call information for [facility]   Thompson Caul, PA-C [phone] (office) [email or web address]

## 2023-04-22 NOTE — Discharge Planning (Signed)
 Patient alert. IV access removed. Discharge teaching given to Joanne Shaw at [facility] memory care. Discharge summary placed in discharge packet along with written prescription written by discharge provider. Patient will be transported to the facility via [facility].

## 2023-04-22 NOTE — TOC Transition Note (Signed)
 Transition of Care [facility]) - Discharge Note   Patient Details  Name: Joanne Shaw MRN: [id number] Date of Birth: March 04, 1931  Transition of Care [facility]) CM/SW Contact:  Lorri Frederick, LCSW Phone Number: 04/22/2023, 3:48 PM   Clinical Narrative:   Pt discharging to [facility] memory care.  RN call report to [phone].     Final next level of care: Assisted Living Barriers to Discharge: No Barriers Identified   Patient Goals and CMS Choice            Discharge Placement              Patient chooses bed at:  [facility] memory care) Patient to be transferred to facility by: [facility] Name of family member notified: left message with son [name] Patient and family notified of of transfer: 04/22/23  Discharge Plan and Services Additional resources added to the After Visit Summary for   In-house Referral: Clinical Social Work                                   Social Drivers of Health (SDOH) Interventions SDOH Screenings   Food Insecurity: Patient Unable To Answer (04/20/2023)  Housing: Patient Unable To Answer (04/20/2023)  Transportation Needs: Patient Unable To Answer (04/20/2023)  Utilities: Patient Unable To Answer (04/20/2023)  Financial Resource Strain: Low Risk  (12/23/2021)   Received from [facility], [facility]  Physical Activity: Inactive (12/23/2021)   Received from [facility], [facility]  Social Connections: Unknown (04/20/2023)  Stress: No Stress Concern Present (12/23/2021)   Received from [facility], [facility]  Tobacco Use: Medium Risk (04/20/2023)     Readmission Risk Interventions    12/11/2022    1:50 PM  Readmission Risk Prevention Plan  Transportation Screening Complete  PCP or Specialist Appt within 5-7 Days Complete  Home Care Screening Complete  Medication Review (RN CM) Complete

## 2023-04-22 NOTE — Discharge Instructions (Signed)
 [facility] Discharge Instructions   General Discharge Instructions  WEIGHT BEARING STATUS:Non-weightbearing right leg  RANGE OF MOTION/ACTIVITY: Ok for hip and knee motion as tolerated  Wound Care: You may remove your surgical dressing on post op day 2 (Tuesday 04/1823). Incisions can be left open to air if there is no drainage. Once the incision is completely dry and without drainage, it may be left open to air out.  Showering may begin post op day 3, (Wednesday 04/23/23).  Clean incision gently with soap and water.  DVT/PE prophylaxis: Aspirin 81 mg x 30 days  Diet: as you were eating previously.  Can use over the counter stool softeners and bowel preparations, such as Miralax, to help with bowel movements.  Narcotics can be constipating.  Be sure to drink plenty of fluids  PAIN MEDICATION USE AND EXPECTATIONS  You have likely been given narcotic medications to help control your pain.  After a traumatic event that results in an fracture (broken bone) with or without surgery, it is ok to use narcotic pain medications to help control one's pain.  We understand that everyone responds to pain differently and each individual patient will be evaluated on a regular basis for the continued need for narcotic medications. Ideally, narcotic medication use should last no more than 6-8 weeks (coinciding with fracture healing).   As a patient it is your responsibility as well to monitor narcotic medication use and report the amount and frequency you use these medications when you come to your office visit.   We would also advise that if you are using narcotic medications, you should take a dose prior to therapy to maximize you participation.  IF YOU ARE ON NARCOTIC MEDICATIONS IT IS NOT PERMISSIBLE TO OPERATE A MOTOR VEHICLE (MOTORCYCLE/CAR/TRUCK/MOPED) OR HEAVY MACHINERY DO NOT MIX NARCOTICS WITH OTHER CNS (CENTRAL NERVOUS SYSTEM) DEPRESSANTS SUCH AS ALCOHOL   STOP SMOKING OR USING  NICOTINE PRODUCTS!!!!  As discussed nicotine severely impairs your body's ability to heal surgical and traumatic wounds but also impairs bone healing.  Wounds and bone heal by forming microscopic blood vessels (angiogenesis) and nicotine is a vasoconstrictor (essentially, shrinks blood vessels).  Therefore, if vasoconstriction occurs to these microscopic blood vessels they essentially disappear and are unable to deliver necessary nutrients to the healing tissue.  This is one modifiable factor that you can do to dramatically increase your chances of healing your injury.    (This means no smoking, no nicotine gum, patches, etc)  DO NOT USE NONSTEROIDAL ANTI-INFLAMMATORY DRUGS (NSAID'S)  Using products such as Advil (ibuprofen), Aleve (naproxen), Motrin (ibuprofen) for additional pain control during fracture healing can delay and/or prevent the healing response.  If you would like to take over the counter (OTC) medication, Tylenol (acetaminophen) is ok.  However, some narcotic medications that are given for pain control contain acetaminophen as well. Therefore, you should not exceed more than 4000 mg of tylenol in a day if you do not have liver disease.  Also note that there are may OTC medicines, such as cold medicines and allergy medicines that my contain tylenol as well.  If you have any questions about medications and/or interactions please ask your doctor/PA or your pharmacist.      ICE AND ELEVATE INJURED/OPERATIVE EXTREMITY  Using ice and elevating the injured extremity above your heart can help with swelling and pain control.  Icing in a pulsatile fashion, such as 20 minutes on and 20 minutes off, can be followed.    Do  not place ice directly on skin. Make sure there is a barrier between to skin and the ice pack.    Using frozen items such as frozen peas works well as the conform nicely to the are that needs to be iced.  USE AN ACE WRAP OR TED HOSE FOR SWELLING CONTROL  In addition to icing and  elevation, Ace wraps or TED hose are used to help limit and resolve swelling.  It is recommended to use Ace wraps or TED hose until you are informed to stop.    When using Ace Wraps start the wrapping distally (farthest away from the body) and wrap proximally (closer to the body)   Example: If you had surgery on your leg or thing and you do not have a splint on, start the ace wrap at the toes and work your way up to the thigh        If you had surgery on your upper extremity and do not have a splint on, start the ace wrap at your fingers and work your way up to the upper arm   CALL THE OFFICE WITH ANY QUESTIONS OR CONCERNS: [phone]   VISIT OUR WEBSITE FOR ADDITIONAL INFORMATION: [email or web address]  Discharge Wound Care Instructions  Do NOT apply any ointments, solutions or lotions to pin sites or surgical wounds.  These prevent needed drainage and even though solutions like hydrogen peroxide kill bacteria, they also damage cells lining the pin sites that help fight infection.  Applying lotions or ointments can keep the wounds moist and can cause them to breakdown and open up as well. This can increase the risk for infection. When in doubt call the office.  Surgical incisions should be dressed daily.  If any drainage is noted, use foam dressings - These dressing supplies should be available at local medical supply stores [facility], [facility], etc) as well as Insurance claims handler ([facility], [facility], [employer], etc)  Once the incision is completely dry and without drainage, it may be left open to air out.  Showering may begin 36-48 hours later.  Cleaning gently with soap and water.  Traumatic wounds should be dressed daily as well.    One layer of adaptic, gauze, Kerlix, then ace wrap.  The adaptic can be discontinued once the draining has ceased    If you have a wet to dry dressing: wet the gauze with saline the squeeze as much saline out so the gauze is moist (not soaking wet), place  moistened gauze over wound, then place a dry gauze over the moist one, followed by Kerlix wrap, then ace wrap.

## 2023-04-22 NOTE — Progress Notes (Signed)
 [facility] Liaison Note:  Patient is to be discharged back to her facility today. Discussed with PMT risk for further decline and necessity of completion of a MOST form as well as close hospice followup. Will likely be inpatient hospice appropriate if declines as well.   Will discuss this with patient's hospice care team.   Glenna Fellows, BSN, RN, [facility] Liaison [phone]

## 2023-04-22 NOTE — Discharge Summary (Signed)
 Physician Discharge Summary   Patient: Joanne Shaw MRN: [id number] DOB: 05/13/1930  Admit date:     04/18/2023  Discharge date: 04/22/23  Discharge Physician: Jacquelin Hawking, MD   PCP: Christene Lye, FNP   Recommendations at discharge:  Hospice follow-up  Discharge Diagnoses: Principal Problem:   Right femoral fracture [facility]) Active Problems:   Malignant neoplasm of female breast (HCC)   Hypothyroidism   Essential hypertension   Severe protein-calorie malnutrition (HCC)   Stage 3b chronic kidney disease (HCC)   Hypokalemia   Hypomagnesemia   Frequent falls   Alzheimer's dementia with anxiety [facility])   Palliative care encounter   Counseling and coordination of care  Resolved Problems:   * No resolved hospital problems. *  Hospital Course: Joanne Shaw is a 89 y.o. female with a history of hypertension, hyperlipidemia, hypothyroidism.  Patient presented secondary to right leg pain after a fall and found to have a displaced right femoral shaft fracture. Orthopedic surgery consulted and performed an ORIF on 2/16. Patient discharged back to ALF with hospice.  Assessment and Plan:  Displaced right femoral shaft fracture Secondary to fall. Orthopedic surgery consulted with performed an ORIF on 2/16. Patient is nonweight bearing to RLE; okay for hip and knee motion.   Acute blood loss anemia Chronic anemia Acute blood loss related to femur fracture. Chronic anemia likely related to chronic illness. Patient's baseline hemoglobin is about 7-8 g/dL. Hemoglobin of 7.6 g/dL on admission with low of 6.8 g/dL requiring a transfusion of 1 unit of PRBC. Post-transfusion hemoglobin of 7.7 g/dL and hemoglobin stable at 8.1 g/dL.   AKI on CKD stage IIIb Baseline creatinine of 2.6-2.8. Creatinine up to 5.70 on admission. Associated BUN of 77. Not a candidate for hemodialysis. Hold torsemide. Ongoing goals of care discussions, however, no hemodialysis.   Metabolic acidosis Sodium  bicarbonate   Cellulitis Diagnosed outpatient. Patient managed on Keflex BID as an outpatient. Antibiotics course completed.   Hypokalemia Mild-moderately low down to 2.9. Resolved with potassium supplementation.   Primary hypertension Continue hydralazine and Toprol-XL   Hyperlipidemia Noted. Patient not on lipid lowering medication. Unlikely helpful with current goals of care.   Hypothyroidism Continue levothyroxine 100 mcg daily   GERD Continue Protonix   Dementia Delirium precautions followed while inpatient.   Severe malnutrition Noted.   Hx of right breast cancer Patient is currently enrolled in hospice care.   Consultants:  Orthopedic surgery Palliative care medicine   Procedures:  None  Disposition: Assisted living Diet recommendation: Regular diet   DISCHARGE MEDICATION: Allergies as of 04/22/2023       Reactions   Amlodipine Other (See Comments)   Reaction not listed on MAR    Codeine Sulfate Nausea And Vomiting   Demerol [meperidine] Nausea Only   Lisinopril Other (See Comments)   Reaction not listed on MAR    Oxycodone Other (See Comments)   Reaction not listed on MAR    Penicillins Swelling   Allergy not listed on MAR         Medication List     STOP taking these medications    cephALEXin 500 MG capsule Commonly known as: Keflex   gabapentin 100 MG capsule Commonly known as: NEURONTIN   torsemide 10 MG tablet Commonly known as: DEMADEX       TAKE these medications    acetaminophen 325 MG tablet Commonly known as: TYLENOL Take 2 tablets (650 mg total) by mouth every 4 (four) hours as needed for mild pain (pain score  1-3). What changed:  when to take this reasons to take this   aspirin EC 81 MG tablet Take 1 tablet (81 mg total) by mouth daily. Swallow whole.   ferrous sulfate 325 (65 FE) MG EC tablet Take 325 mg by mouth every other day.   hydrALAZINE 25 MG tablet Commonly known as: APRESOLINE Take 25 mg by mouth  3 (three) times daily.   levothyroxine 100 MCG tablet Commonly known as: SYNTHROID Take 100 mcg by mouth daily before breakfast.   metoprolol succinate 50 MG 24 hr tablet Commonly known as: TOPROL-XL TAKE 1 TABLET BY MOUTH EVERY DAY   pantoprazole 40 MG tablet Commonly known as: PROTONIX Take 40 mg by mouth every evening.   polyethylene glycol 17 g packet Commonly known as: MIRALAX / GLYCOLAX Take 17 g by mouth daily.   senna 8.6 MG Tabs tablet Commonly known as: SENOKOT Take 17.2 mg by mouth daily as needed for mild constipation or moderate constipation.   sertraline 50 MG tablet Commonly known as: ZOLOFT Take 50 mg by mouth daily.   sodium bicarbonate 650 MG tablet Take 1 tablet (650 mg total) by mouth daily.   traMADol 50 MG tablet Commonly known as: ULTRAM Take 1 tablet (50 mg total) by mouth every 12 (twelve) hours as needed for moderate pain (pain score 4-6) or severe pain (pain score 7-10). What changed:  medication strength how much to take when to take this reasons to take this               Discharge Care Instructions  (From admission, onward)           Start     Ordered   04/22/23 0000  Discharge wound care:       Comments: Reinforce dressing   04/22/23 1513            Discharge Exam: BP (!) 152/63 (BP Location: Right Arm)   Pulse 60   Temp 97.8 F (36.6 C) (Oral)   Resp 18   Ht 5\' 3"  (1.6 m)   Wt 54.8 kg   SpO2 96%   BMI 21.40 kg/m   General exam: Appears calm and comfortable Respiratory system: Clear to auscultation. Respiratory effort normal. Cardiovascular system: S1 & S2 heard, RRR. Gastrointestinal system: Abdomen is nondistended, soft and nontender. Normal bowel sounds heard. Central nervous system: Alert.   Condition at discharge: stable  The results of significant diagnostics from this hospitalization (including imaging, microbiology, ancillary and laboratory) are listed below for reference.   Imaging  Studies: US RENAL Result Date: 04/21/2023 CLINICAL DATA:  Acute kidney injury EXAM: RENAL / URINARY TRACT ULTRASOUND COMPLETE COMPARISON:  CT 12/09/2022 FINDINGS: Right Kidney: Renal measurements: 10.5 x 4.9 x 2.6 cm = volume: 205 mL. Echogenic cortex. No hydronephrosis. Multiple greater than 10 cysts. Largest on the right is seen at the mid pole and measures 5.2 x 2.2 x 4.5 cm. Left Kidney: Renal measurements: 8.5 x 4.5 x 6.5 cm = volume: 130.4 mL. Echogenic cortex. No hydronephrosis. Multiple, greater than 10 cysts. The largest is seen at the midpole and measures 2 x 2.4 x 2.3 cm. Bladder: Appears normal for degree of bladder distention. Other: None. IMPRESSION: 1. Echogenic renal cortices consistent with medical renal disease. No hydronephrosis. 2. Multiple bilateral renal cysts. No imaging follow-up is recommended Electronically Signed   By: Jasmine Pang M.D.   On: 04/21/2023 22:46   DG FEMUR PORT, MIN 2 VIEWS RIGHT Result Date: 04/20/2023 CLINICAL DATA:  Fracture, postop. EXAM: RIGHT FEMUR PORTABLE 2 VIEW COMPARISON:  Radiograph 04/18/2023 FINDINGS: Lateral plate and screw fixation of comminuted femoral shaft fracture. There is mild residual displacement, although overall improved alignment. Previous right hip pinning. Recent postsurgical change includes air and edema in the soft tissues. IMPRESSION: ORIF femoral shaft fracture. Electronically Signed   By: Narda Rutherford M.D.   On: 04/20/2023 15:17   DG FEMUR, MIN 2 VIEWS RIGHT Result Date: 04/20/2023 CLINICAL DATA:  Elective surgery. EXAM: RIGHT FEMUR 2 VIEWS COMPARISON:  Preoperative imaging FINDINGS: Eight fluoroscopic spot views of the right femur from the operating room. Lateral plate and screw fixation of femoral shaft fracture. Previous right proximal femur pinning. Fluoroscopy time 75.2 seconds. Dose 4.68 mGy. IMPRESSION: Procedural fluoroscopy during femur fracture ORIF. Electronically Signed   By: Narda Rutherford M.D.   On: 04/20/2023  14:41   DG C-Arm 1-60 Min-No Report Result Date: 04/20/2023 Fluoroscopy was utilized by the requesting physician.  No radiographic interpretation.   DG C-Arm 1-60 Min-No Report Result Date: 04/20/2023 Fluoroscopy was utilized by the requesting physician.  No radiographic interpretation.   DG Chest Portable 1 View Result Date: 04/18/2023 CLINICAL DATA:  Right femur fracture. EXAM: PORTABLE CHEST 1 VIEW COMPARISON:  Chest radiograph 12/09/2022 FINDINGS: Chronic cardiomegaly. Unchanged mediastinal contours. Aortic atherosclerosis. Chronic interstitial coarsening without focal airspace disease. No pleural fluid or pneumothorax. No acute osseous findings. Surgical clips project over the right hemithorax. IMPRESSION: 1. No acute findings. 2. Chronic interstitial coarsening. Electronically Signed   By: Narda Rutherford M.D.   On: 04/18/2023 21:30   DG Femur Portable Min 2 Views Right Result Date: 04/18/2023 CLINICAL DATA:  Deformity.  Right hip pain. EXAM: RIGHT FEMUR PORTABLE 2 VIEW COMPARISON:  None Available. FINDINGS: The bones are diffusely under mineralized. There is displaced fracture of the proximal femoral shaft. Osseous overriding of approximately 4 cm. Previous pinning of the right femoral neck. Advanced right hip arthropathy. The distal femur is intact. Knee alignment is maintained. IMPRESSION: Displaced proximal femoral shaft fracture with osseous overriding. Electronically Signed   By: Narda Rutherford M.D.   On: 04/18/2023 21:28   VAS [place] LOWER EXTREMITY VENOUS (DVT) Result Date: 04/03/2023  Lower Venous DVT Study Patient Name:  CINTHYA BORS  Date of Exam:   04/03/2023 Medical Rec #: [id number]       Accession #:    [id number] Date of Birth: Nov 19, 1930       Patient Gender: F Patient Age:   40 years Exam [facility]  [facility] Procedure:      VAS [place] LOWER EXTREMITY VENOUS (DVT) Referring Phys: DAN FLOYD --------------------------------------------------------------------------------   Indications: Pain.  Risk Factors: None identified. Limitations: Poor ultrasound/tissue interface and patient positioning. Comparison Study: No prior studies. Performing Technologist: Chanda Busing RVT  Examination Guidelines: A complete evaluation includes B-mode imaging, spectral Doppler, color Doppler, and power Doppler as needed of all accessible portions of each vessel. Bilateral testing is considered an integral part of a complete examination. Limited examinations for reoccurring indications may be performed as noted. The reflux portion of the exam is performed with the patient in reverse Trendelenburg.  +-----+---------------+---------+-----------+----------+--------------+ RIGHTCompressibilityPhasicitySpontaneityPropertiesThrombus Aging +-----+---------------+---------+-----------+----------+--------------+ CFV  Full           Yes      No                                  +-----+---------------+---------+-----------+----------+--------------+   +---------+---------------+---------+-----------+----------+--------------+ LEFT  CompressibilityPhasicitySpontaneityPropertiesThrombus Aging +---------+---------------+---------+-----------+----------+--------------+ CFV      Full           Yes      No                                  +---------+---------------+---------+-----------+----------+--------------+ SFJ      Full                                                        +---------+---------------+---------+-----------+----------+--------------+ FV Prox  Full                                                        +---------+---------------+---------+-----------+----------+--------------+ FV Mid   Full                                                        +---------+---------------+---------+-----------+----------+--------------+ FV DistalFull                                                         +---------+---------------+---------+-----------+----------+--------------+ PFV      Full                                                        +---------+---------------+---------+-----------+----------+--------------+ POP      Full           Yes      No                                  +---------+---------------+---------+-----------+----------+--------------+ PTV      Full                                                        +---------+---------------+---------+-----------+----------+--------------+ PERO     Full                                                        +---------+---------------+---------+-----------+----------+--------------+    Summary: RIGHT: - No evidence of common femoral vein obstruction.   LEFT: - There is no evidence of deep vein thrombosis in the lower extremity.  - No cystic structure found in the popliteal fossa.  *See table(s) above for measurements and observations. Electronically signed by  Sherald Hess MD on 04/03/2023 at 2:38:56 PM.    Final    DG Tibia/Fibula Left Result Date: 04/03/2023 CLINICAL DATA:  Bilateral leg swelling and pain. Wound on the left lower leg. History of dementia. EXAM: LEFT TIBIA AND FIBULA - 2 VIEW COMPARISON:  None Available. FINDINGS: Diffuse osseous demineralization. No acute fracture or dislocation. No evidence of osteolysis. Mild-to-moderate degenerative changes of the knee with chondrocalcinosis in the medial and lateral femorotibial compartments. Calcaneal enthesopathy at the insertion of the Achilles tendon. Focal soft tissue prominence at the anterior mid shin, could correspond to patient's reported wound. No soft tissue gas. No radiopaque foreign body. IMPRESSION: 1. No acute osseous abnormality. 2. Focal soft tissue prominence at the anterior mid shin, could correspond to patient's reported wound. No soft tissue gas or radiopaque foreign body. 3. Mild-to-moderate degenerative changes of the knee with  chondrocalcinosis. Electronically Signed   By: Hart Robinsons M.D.   On: 04/03/2023 12:59    Microbiology: Results for orders placed or performed during the hospital encounter of 04/18/23  Surgical pcr screen     Status: None   Collection Time: 04/20/23  6:17 AM   Specimen: Nasal Mucosa; Nasal Swab  Result Value Ref Range Status   MRSA, PCR NEGATIVE NEGATIVE Final   Staphylococcus aureus NEGATIVE NEGATIVE Final    Comment: (NOTE) The Xpert SA Assay (FDA approved for NASAL specimens in patients 30 years of age and older), is one component of a comprehensive surveillance program. It is not intended to diagnose infection nor to guide or monitor treatment. Performed at [facility], [address], [place], [place] [address]     Labs: CBC: Recent Labs  Lab 04/18/23 2021 04/19/23 0520 04/20/23 0418 04/21/23 0537 04/22/23 0849  WBC 10.5 8.0 7.5 10.6* 9.7  NEUTROABS 8.2*  --   --   --   --   HGB 7.6* 6.8* 7.7* 8.1* 7.4*  HCT 25.2* 22.0* 23.1* 24.8* 22.6*  MCV 103.3* 102.3* 94.7 93.6 94.6  PLT 335 292 247 245 214   Basic Metabolic Panel: Recent Labs  Lab 04/18/23 2021 04/19/23 0520 04/19/23 1714 04/20/23 0418 04/21/23 0537  NA 133* 138  --  137 136  K 2.9* 2.9*  --  4.0 4.6  CL 103 108  --  107 109  CO2 17* 18*  --  16* 13*  GLUCOSE 115* 94  --  78 98  BUN 77* 75*  --  72* 76*  CREATININE 5.70* 5.27*  --  5.56* 5.55*  CALCIUM 8.2* 8.1*  --  8.3* 8.0*  MG  --   --  1.5* 1.4*  --    Liver Function Tests: Recent Labs  Lab 04/19/23 0520  AST 96*  ALT 70*  ALKPHOS 87  BILITOT 0.6  PROT 5.8*  ALBUMIN 2.4*    Discharge time spent: 35 minutes.  Signed: Jacquelin Hawking, MD [facility] 04/22/2023

## 2023-04-22 NOTE — [place]2 (Signed)
 [place] MEDICAID FL2 LEVEL OF CARE FORM     IDENTIFICATION  Patient Name: Joanne Shaw Birthdate: December 30, 1930 Sex: female Admission Date (Current Location): 04/18/2023  [facility] and [place] Number:  Producer, television/film/video and Address:  [facility] [place], [address] [place], [place] [address]      Provider Number: [id number]  Attending Physician Name and Address:  Narda Bonds, MD  Relative Name and Phone Number:  Lounell, Schumacher   [phone]    Current Level of Care: Hospital Recommended Level of Care: Assisted Living Facility [facility] memory care) Prior Approval Number:    Date Approved/Denied:   PASRR Number:    Discharge Plan: Other (Comment) [facility] memory care)    Current Diagnoses: Patient Active Problem List   Diagnosis Date Noted   Palliative care encounter 04/19/2023   Counseling and coordination of care 04/19/2023   Right femoral fracture (HCC) 04/18/2023   Periorbital contusion of left eye 12/10/2022   AKI (acute kidney injury) (HCC) 12/09/2022   Anemia due to blood loss 06/30/2022   Severe protein-calorie malnutrition (HCC) 06/30/2022   Stage 3b chronic kidney disease (HCC) 06/30/2022   Hypokalemia 06/30/2022   Hypomagnesemia 06/30/2022   Frequent falls 05/26/2022   Alzheimer's dementia with anxiety (HCC) 05/26/2022   RSV infection 05/26/2022   Asymptomatic postmenopausal status 03/06/2010   Malignant neoplasm of female breast (HCC) 01/16/2010   Abnormal mammogram 11/20/2009   OSTEOARTHRITIS, KNEE, RIGHT 07/13/2009   Hypothyroidism 05/30/2009   HYPERLIPIDEMIA 05/30/2009   Essential hypertension 05/30/2009    Orientation RESPIRATION BLADDER Height & Weight     Self  Normal Incontinent, External catheter Weight: 120 lb 13 oz (54.8 kg) Height:  5\' 3"  (160 cm)  BEHAVIORAL SYMPTOMS/MOOD NEUROLOGICAL BOWEL NUTRITION STATUS      Incontinent Diet (regular diet)  AMBULATORY STATUS COMMUNICATION OF NEEDS  Skin   Total Care Verbally Other (Comment) (ecchymosis)                       Personal Care Assistance Level of Assistance  Total care       Total Care Assistance: Maximum assistance   Functional Limitations Info             SPECIAL CARE FACTORS FREQUENCY                       Contractures Contractures Info: Not present    Additional Factors Info  Code Status, Allergies Code Status Info: DNR Allergies Info: Amlodipine, Codeine Sulfate, Demerol (Meperidine), Lisinopril, Oxycodone, Penicillins           Current Medications (04/22/2023):  This is the current hospital active medication list Current Facility-Administered Medications  Medication Dose Route Frequency Provider Last Rate Last Admin   0.9 %  sodium chloride infusion (Manually program via Guardrails IV Fluids)   Intravenous Once Thyra Breed A, PA-C       0.9 %  sodium chloride infusion (Manually program via Guardrails IV Fluids)   Intravenous Once West Bali, PA-C       acetaminophen (TYLENOL) tablet 650 mg  650 mg Oral Q4H PRN [place] Bali, PA-C   650 mg at 04/20/23 0156   aspirin EC tablet 81 mg  81 mg Oral Daily West Bali, PA-C   81 mg at 04/22/23 0945   cephALEXin (KEFLEX) capsule 500 mg  500 mg Oral Daily Narda Bonds, MD   500 mg at 04/22/23  0944   docusate sodium (COLACE) capsule 100 mg  100 mg Oral BID West Bali, PA-C   100 mg at 04/22/23 0944   fentaNYL (SUBLIMAZE) injection 25 mcg  25 mcg Intravenous Q2H PRN West Bali, PA-C   25 mcg at 04/21/23 1143   ferrous sulfate tablet 325 mg  325 mg Oral QODAY West Bali, PA-C   325 mg at 04/21/23 1033   hydrALAZINE (APRESOLINE) tablet 25 mg  25 mg Oral TID West Bali, PA-C   25 mg at 04/22/23 8119   HYDROmorphone (DILAUDID) injection 0.5 mg  0.5 mg Intravenous Q2H PRN Haskel Khan, NP   0.5 mg at 04/21/23 1348   levothyroxine (SYNTHROID) tablet 100 mcg  100 mcg Oral [id number] West Bali, PA-C   100 mcg  at 04/22/23 0544   metoCLOPramide (REGLAN) tablet 5-10 mg  5-10 mg Oral Q8H PRN West Bali, PA-C       Or   metoCLOPramide (REGLAN) injection 5-10 mg  5-10 mg Intravenous Q8H PRN [place] Bali, PA-C       metoprolol succinate (TOPROL-XL) 24 hr tablet 50 mg  50 mg Oral Daily Thyra Breed A, PA-C   50 mg at 04/22/23 0944   ondansetron (ZOFRAN) tablet 4 mg  4 mg Oral Q6H PRN West Bali, PA-C       Or   ondansetron (ZOFRAN) injection 4 mg  4 mg Intravenous Q6H PRN McClung, Sarah A, PA-C       pantoprazole (PROTONIX) EC tablet 40 mg  40 mg Oral QPM Thyra Breed A, PA-C   40 mg at 04/20/23 1938   polyethylene glycol (MIRALAX / GLYCOLAX) packet 17 g  17 g Oral Daily West Bali, PA-C   17 g at 04/22/23 9562   senna (SENOKOT) tablet 17.2 mg  17.2 mg Oral Daily PRN West Bali, PA-C       sertraline (ZOLOFT) tablet 50 mg  50 mg Oral Daily Thyra Breed A, PA-C   50 mg at 04/22/23 0945   sodium bicarbonate tablet 650 mg  650 mg Oral TID Narda Bonds, MD   650 mg at 04/22/23 0945   traMADol (ULTRAM) tablet 50 mg  50 mg Oral Q12H PRN West Bali, PA-C   50 mg at 04/21/23 1308     Discharge Medications: Please see discharge summary for a list of discharge medications.  Relevant Imaging Results:  Relevant Lab Results:   Additional Information SSN: [id number]  Lorri Frederick, LCSW

## 2023-04-22 NOTE — Progress Notes (Signed)
 Patient ID: Joanne Shaw, female   DOB: 1930/05/24, 89 y.o.   MRN: [id number]    Progress Note from the Palliative Medicine Team at [facility]   Patient Name: Joanne Shaw        Date: 04/22/2023 DOB: June 05, 1930  Age: 89 y.o. MRN#: [id number] Attending Physician: Narda Bonds, MD Primary Care Physician: Christene Lye, FNP Admit Date: 04/18/2023   Reason for Consultation/Follow-up   Establishing Goals of Care   HPI/ Brief Hospital Review  89 year old female with a past medical history of right breast cancer, hyperlipidemia, hypertension, hypothyroidism, and dementia who was receiving long-term care management at a facility with hospice support was admitted on 04/18/2023 for management after a potential fall sustaining displaced right femoral shaft fracture.    Patient seen in [place] ER for management and orthopedics consulted for evaluation.  Patient also receiving medical management for acute blood loss anemia, acute kidney injury on CKD, and multiple electrolyte abnormalities.   Creatinine 5.5/ today.    Palliative medicine team consulted to assist with complex medical decision making.   Patient currently receiving services from [facility].  She is medically stable for discharge back to SNF     Patient and family face ongoing decisions regarding treatment options, advanced directives and anticipatory care needs.   Subjective  Extensive chart review has been completed prior to meeting with patient/family  including labs, vital signs, imaging, progress/consult notes, orders, medications and available advance directive documents.    This NP assessed patient at the bedside as a follow up palliative medicine needs and emotional support.  Patient is without medical capacity.  Plan is for patient to discharge back to facility today.  An attempt to close communication I placed  phone call to son Joanne Shaw, Left message await callback.   I was able  to speak to hospice liaison, confirming transition of care back to facility with hospice services in place.  Patient has only been with hospice services for a few days.Marland Kitchen  Hospice will make every attempt to see patient as soon as possible once she is discharged back to facility.  Communicated with hospice the importance of follow-up with family for ongoing conversation regarding next steps in treatment plan.  Hopefully a MOST form can be completed asap with family       Discussed with primary team and nursing staff and hospice liaison    Time: 35   minutes  Detailed review of medical records ( labs, imaging, vital signs), medically appropriate exam ( MS, skin, cardiac,  resp)   discussed with treatment team, counseling and education to patient, family, staff, documenting clinical information, medication management, coordination of care    Lorinda Creed NP  Palliative Medicine Team Team Phone # [phone] Pager [phone]
